# Patient Record
Sex: Female | Born: 1990 | Race: Black or African American | Hispanic: No | Marital: Single | Smoking: Former smoker
Health system: Southern US, Community
[De-identification: ages and names within clinical notes are randomized; demographics above are authoritative.]

## PROBLEM LIST (undated history)

## (undated) ENCOUNTER — Inpatient Hospital Stay (HOSPITAL_COMMUNITY): Payer: Self-pay

## (undated) DIAGNOSIS — F329 Major depressive disorder, single episode, unspecified: Secondary | ICD-10-CM

## (undated) DIAGNOSIS — Z91410 Personal history of adult physical and sexual abuse: Secondary | ICD-10-CM

## (undated) DIAGNOSIS — F32A Depression, unspecified: Secondary | ICD-10-CM

## (undated) DIAGNOSIS — F419 Anxiety disorder, unspecified: Secondary | ICD-10-CM

## (undated) DIAGNOSIS — R519 Headache, unspecified: Secondary | ICD-10-CM

## (undated) DIAGNOSIS — R0600 Dyspnea, unspecified: Secondary | ICD-10-CM

## (undated) DIAGNOSIS — IMO0002 Reserved for concepts with insufficient information to code with codable children: Secondary | ICD-10-CM

## (undated) DIAGNOSIS — A5909 Other urogenital trichomoniasis: Secondary | ICD-10-CM

## (undated) DIAGNOSIS — R87619 Unspecified abnormal cytological findings in specimens from cervix uteri: Secondary | ICD-10-CM

## (undated) HISTORY — PX: NO PAST SURGERIES: SHX2092

## (undated) HISTORY — DX: Personal history of adult physical and sexual abuse: Z91.410

## (undated) HISTORY — DX: Depression, unspecified: F32.A

## (undated) HISTORY — DX: Anxiety disorder, unspecified: F41.9

## (undated) HISTORY — DX: Headache, unspecified: R51.9

## (undated) HISTORY — DX: Other urogenital trichomoniasis: A59.09

---
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
## 2012-01-14 ENCOUNTER — Encounter (HOSPITAL_COMMUNITY): Payer: Self-pay | Admitting: *Deleted

## 2012-01-14 ENCOUNTER — Inpatient Hospital Stay (HOSPITAL_COMMUNITY)
Admission: AD | Admit: 2012-01-14 | Discharge: 2012-01-17 | DRG: 774 | Disposition: A | Discharge status: Home / Self Care | Payer: Medicaid Other | Source: Ambulatory Visit | Attending: Obstetrics and Gynecology | Admitting: Obstetrics and Gynecology

## 2012-01-14 DIAGNOSIS — A5619 Other chlamydial genitourinary infection: Secondary | ICD-10-CM | POA: Diagnosis present

## 2012-01-14 DIAGNOSIS — O99892 Other specified diseases and conditions complicating childbirth: Secondary | ICD-10-CM | POA: Diagnosis present

## 2012-01-14 DIAGNOSIS — N739 Female pelvic inflammatory disease, unspecified: Secondary | ICD-10-CM | POA: Diagnosis present

## 2012-01-14 DIAGNOSIS — O98319 Other infections with a predominantly sexual mode of transmission complicating pregnancy, unspecified trimester: Secondary | ICD-10-CM | POA: Diagnosis present

## 2012-01-14 DIAGNOSIS — Z22330 Carrier of Group B streptococcus: Secondary | ICD-10-CM

## 2012-01-14 HISTORY — DX: Reserved for concepts with insufficient information to code with codable children: IMO0002

## 2012-01-14 HISTORY — DX: Unspecified abnormal cytological findings in specimens from cervix uteri: R87.619

## 2012-01-14 NOTE — Progress Notes (Signed)
Contractions , was seen in office today for ?ROM, SVE 3 cm

## 2012-01-14 NOTE — Progress Notes (Signed)
Pt states she started having contractions about 2040 tonight. Pt states pain has increased and contractions are 3-4 minutes apart

## 2012-01-15 ENCOUNTER — Encounter (HOSPITAL_COMMUNITY): Payer: Self-pay | Admitting: Anesthesiology

## 2012-01-15 ENCOUNTER — Inpatient Hospital Stay (HOSPITAL_COMMUNITY): Payer: Medicaid Other | Admitting: Anesthesiology

## 2012-01-15 ENCOUNTER — Encounter (HOSPITAL_COMMUNITY): Payer: Self-pay

## 2012-01-15 LAB — CBC
HCT: 40.2 % (ref 36.0–46.0)
Platelets: 233 10*3/uL (ref 150–400)
RBC: 4.67 MIL/uL (ref 3.87–5.11)
RDW: 15.2 % (ref 11.5–15.5)
WBC: 8.7 10*3/uL (ref 4.0–10.5)

## 2012-01-15 MED ORDER — OXYTOCIN BOLUS FROM INFUSION
500.0000 mL | Freq: Once | INTRAVENOUS | Status: DC
  Filled 2012-01-15: qty 500

## 2012-01-15 MED ORDER — WITCH HAZEL-GLYCERIN EX PADS: 1.0000 "application " | MEDICATED_PAD | CUTANEOUS | Status: DC | PRN

## 2012-01-15 MED ORDER — OXYTOCIN 20 UNITS IN LACTATED RINGERS INFUSION - SIMPLE
125.0000 mL/h | Freq: Once | INTRAVENOUS | Status: AC
  Administered 2012-01-15: 999 mL/h via INTRAVENOUS

## 2012-01-15 MED ORDER — LIDOCAINE HCL 1.5 % IJ SOLN
INTRAMUSCULAR | Status: DC | PRN
  Administered 2012-01-15: 4 mL via EPIDURAL
  Administered 2012-01-15: 5 mL via EPIDURAL

## 2012-01-15 MED ORDER — LACTATED RINGERS IV SOLN: 500.0000 mL | INTRAVENOUS | Status: DC | PRN

## 2012-01-15 MED ORDER — PENICILLIN G POTASSIUM 5000000 UNITS IJ SOLR
2.5000 10*6.[IU] | INTRAVENOUS | Status: DC
  Administered 2012-01-15: 2.5 10*6.[IU] via INTRAVENOUS
  Filled 2012-01-15 (×4): qty 2.5

## 2012-01-15 MED ORDER — ONDANSETRON HCL 4 MG PO TABS: 4.0000 mg | ORAL_TABLET | ORAL | Status: DC | PRN

## 2012-01-15 MED ORDER — LIDOCAINE HCL (PF) 1 % IJ SOLN
30.0000 mL | INTRAMUSCULAR | Status: DC | PRN
  Administered 2012-01-15: 30 mL via SUBCUTANEOUS
  Filled 2012-01-15: qty 30

## 2012-01-15 MED ORDER — LACTATED RINGERS IV SOLN
INTRAVENOUS | Status: DC
  Administered 2012-01-15: 02:00:00 via INTRAVENOUS

## 2012-01-15 MED ORDER — DIPHENHYDRAMINE HCL 25 MG PO CAPS: 25.0000 mg | ORAL_CAPSULE | Freq: Four times a day (QID) | ORAL | Status: DC | PRN

## 2012-01-15 MED ORDER — CITRIC ACID-SODIUM CITRATE 334-500 MG/5ML PO SOLN: 30.0000 mL | ORAL | Status: DC | PRN

## 2012-01-15 MED ORDER — FLEET ENEMA 7-19 GM/118ML RE ENEM: 1.0000 | ENEMA | RECTAL | Status: DC | PRN

## 2012-01-15 MED ORDER — AZITHROMYCIN 1 G PO PACK
1.0000 g | PACK | Freq: Once | ORAL | Status: AC
  Administered 2012-01-15: 1 g via ORAL
  Filled 2012-01-15: qty 1

## 2012-01-15 MED ORDER — EPHEDRINE 5 MG/ML INJ: 10.0000 mg | INTRAVENOUS | Status: DC | PRN

## 2012-01-15 MED ORDER — OXYTOCIN 20 UNITS IN LACTATED RINGERS INFUSION - SIMPLE
1.0000 m[IU]/min | INTRAVENOUS | Status: DC
  Administered 2012-01-15: 1 m[IU]/min via INTRAVENOUS
  Filled 2012-01-15: qty 1000

## 2012-01-15 MED ORDER — PHENYLEPHRINE 40 MCG/ML (10ML) SYRINGE FOR IV PUSH (FOR BLOOD PRESSURE SUPPORT)
80.0000 ug | PREFILLED_SYRINGE | INTRAVENOUS | Status: DC | PRN
  Filled 2012-01-15: qty 5

## 2012-01-15 MED ORDER — SIMETHICONE 80 MG PO CHEW: 80.0000 mg | CHEWABLE_TABLET | ORAL | Status: DC | PRN

## 2012-01-15 MED ORDER — PHENYLEPHRINE 40 MCG/ML (10ML) SYRINGE FOR IV PUSH (FOR BLOOD PRESSURE SUPPORT): 80.0000 ug | PREFILLED_SYRINGE | INTRAVENOUS | Status: DC | PRN

## 2012-01-15 MED ORDER — IBUPROFEN 600 MG PO TABS: 600.0000 mg | ORAL_TABLET | Freq: Four times a day (QID) | ORAL | Status: DC | PRN

## 2012-01-15 MED ORDER — DIBUCAINE 1 % RE OINT: 1.0000 "application " | TOPICAL_OINTMENT | RECTAL | Status: DC | PRN

## 2012-01-15 MED ORDER — ONDANSETRON HCL 4 MG/2ML IJ SOLN: 4.0000 mg | Freq: Four times a day (QID) | INTRAMUSCULAR | Status: DC | PRN

## 2012-01-15 MED ORDER — PRENATAL MULTIVITAMIN CH
1.0000 | ORAL_TABLET | Freq: Every day | ORAL | Status: DC
  Administered 2012-01-15 – 2012-01-16 (×2): 1 via ORAL
  Filled 2012-01-15 (×2): qty 1

## 2012-01-15 MED ORDER — EPHEDRINE 5 MG/ML INJ
10.0000 mg | INTRAVENOUS | Status: DC | PRN
  Filled 2012-01-15: qty 4

## 2012-01-15 MED ORDER — OXYCODONE-ACETAMINOPHEN 5-325 MG PO TABS: 1.0000 | ORAL_TABLET | ORAL | Status: DC | PRN

## 2012-01-15 MED ORDER — LANOLIN HYDROUS EX OINT: TOPICAL_OINTMENT | CUTANEOUS | Status: DC | PRN

## 2012-01-15 MED ORDER — ONDANSETRON HCL 4 MG/2ML IJ SOLN: 4.0000 mg | INTRAMUSCULAR | Status: DC | PRN

## 2012-01-15 MED ORDER — DIPHENHYDRAMINE HCL 50 MG/ML IJ SOLN: 12.5000 mg | INTRAMUSCULAR | Status: DC | PRN

## 2012-01-15 MED ORDER — FENTANYL 2.5 MCG/ML BUPIVACAINE 1/10 % EPIDURAL INFUSION (WH - ANES)
INTRAMUSCULAR | Status: DC | PRN
  Administered 2012-01-15: 15 mL/h via EPIDURAL

## 2012-01-15 MED ORDER — BENZOCAINE-MENTHOL 20-0.5 % EX AERO: 1.0000 "application " | INHALATION_SPRAY | CUTANEOUS | Status: DC | PRN

## 2012-01-15 MED ORDER — ZOLPIDEM TARTRATE 5 MG PO TABS: 5.0000 mg | ORAL_TABLET | Freq: Every evening | ORAL | Status: DC | PRN

## 2012-01-15 MED ORDER — IBUPROFEN 600 MG PO TABS
600.0000 mg | ORAL_TABLET | Freq: Four times a day (QID) | ORAL | Status: DC
  Administered 2012-01-15 – 2012-01-17 (×8): 600 mg via ORAL
  Filled 2012-01-15 (×8): qty 1

## 2012-01-15 MED ORDER — LACTATED RINGERS IV SOLN
500.0000 mL | Freq: Once | INTRAVENOUS | Status: AC
  Administered 2012-01-15: 500 mL via INTRAVENOUS

## 2012-01-15 MED ORDER — SENNOSIDES-DOCUSATE SODIUM 8.6-50 MG PO TABS
2.0000 | ORAL_TABLET | Freq: Every day | ORAL | Status: DC
  Administered 2012-01-15 – 2012-01-16 (×2): 2 via ORAL

## 2012-01-15 MED ORDER — ACETAMINOPHEN 325 MG PO TABS: 650.0000 mg | ORAL_TABLET | ORAL | Status: DC | PRN

## 2012-01-15 MED ORDER — TETANUS-DIPHTH-ACELL PERTUSSIS 5-2.5-18.5 LF-MCG/0.5 IM SUSP: 0.5000 mL | Freq: Once | INTRAMUSCULAR | Status: DC

## 2012-01-15 MED ORDER — TERBUTALINE SULFATE 1 MG/ML IJ SOLN: 0.2500 mg | Freq: Once | INTRAMUSCULAR | Status: DC | PRN

## 2012-01-15 MED ORDER — PENICILLIN G POTASSIUM 5000000 UNITS IJ SOLR
5.0000 10*6.[IU] | Freq: Once | INTRAVENOUS | Status: AC
  Administered 2012-01-15: 5 10*6.[IU] via INTRAVENOUS
  Filled 2012-01-15: qty 5

## 2012-01-15 MED ORDER — FENTANYL 2.5 MCG/ML BUPIVACAINE 1/10 % EPIDURAL INFUSION (WH - ANES)
14.0000 mL/h | INTRAMUSCULAR | Status: DC
  Administered 2012-01-15: 14 mL/h via EPIDURAL
  Filled 2012-01-15 (×2): qty 60

## 2012-01-15 NOTE — Progress Notes (Signed)
   Subjective: Pt comfortable, pushing with intermittent pressure  Objective: BP 130/73  Pulse 107  Temp(Src) 98.1 F (36.7 C) (Oral)  Resp 18  Ht 5\' 10"  (1.778 m)  Wt 117.482 kg (259 lb)  BMI 37.16 kg/m2  SpO2 100%  LMP 04/21/2011 I/O last 3 completed shifts: In: -  Out: 1500 [Urine:1500]    FHT:  FHR: 130 bpm, variability: moderate,  accelerations:  Present,  decelerations:  Absent UC:   regular, every 2-3 minutes SVE:   Dilation: 10 Effacement (%): 100 Station: +2 Exam by:: Dr. Senaida Ores Pt pushing about 30 minutes with good progress  Labs: Lab Results  Component Value Date   WBC 8.7 01/15/2012   HGB 13.3 01/15/2012   HCT 40.2 01/15/2012   MCV 86.1 01/15/2012   PLT 233 01/15/2012    Assessment / Plan: Pushing Christina Hancock W 01/15/2012, 8:54 AM

## 2012-01-15 NOTE — Progress Notes (Signed)
Pt may go to room 167. 

## 2012-01-15 NOTE — Anesthesia Procedure Notes (Signed)
Epidural Patient location during procedure: OB Start time: 01/15/2012 2:15 AM  Staffing Anesthesiologist: Alquan Morrish A. Performed by: anesthesiologist   Preanesthetic Checklist Completed: patient identified, site marked, surgical consent, pre-op evaluation, timeout performed, IV checked, risks and benefits discussed and monitors and equipment checked  Epidural Patient position: sitting Prep: site prepped and draped and DuraPrep Patient monitoring: continuous pulse ox and blood pressure Approach: midline Injection technique: LOR air  Needle:  Needle type: Tuohy  Needle gauge: 17 G Needle length: 9 cm Needle insertion depth: 9 cm Catheter type: closed end flexible Catheter size: 19 Gauge Catheter at skin depth: 14 cm Test dose: negative and 1.5% lidocaine  Assessment Events: blood not aspirated, injection not painful, no injection resistance, negative IV test and no paresthesia  Additional Notes Patient identified. Risks and benefits discussed including failed block, incomplete  Pain control, post dural puncture headache, nerve damage, paralysis, blood pressure Changes, nausea, vomiting, reactions to medications-both toxic and allergic and post Partum back pain. All questions were answered. Patient expressed understanding and wished to proceed. Sterile technique was used throughout procedure. Epidural site was Dressed with sterile barrier dressing. No paresthesias, signs of intravascular injection Or signs of intrathecal spread were encountered.  Patient was more comfortable after the epidural was dosed. Please see RN's note for documentation of vital signs and FHR which are stable.

## 2012-01-15 NOTE — Anesthesia Postprocedure Evaluation (Signed)
  Anesthesia Post-op Note  Patient: Therapist, occupational  Procedure(s) Performed: * No procedures listed *  Patient Location: 109  Anesthesia Type: Epidural  Level of Consciousness: awake, alert  and oriented  Airway and Oxygen Therapy: Patient Spontanous Breathing  Post-op Pain: none  Post-op Assessment: Post-op Vital signs reviewed, Patient's Cardiovascular Status Stable, No headache, No backache, No residual numbness and No residual motor weakness  Post-op Vital Signs: Reviewed and stable  Complications: No apparent anesthesia complications

## 2012-01-15 NOTE — Progress Notes (Signed)
Patient ID: Christina Hancock, female   DOB: [birth year], 21 y.o.   MRN: [id number] Pt has made slow progress since admission in spite of pitocin. Per the RN exam the cervix was 7 cm dilated but I feel she is 5 cm . AROM produced possibly slightly meconium stained fluid.

## 2012-01-15 NOTE — H&P (Signed)
Christina Hancock, CARRIGER NO.:  [id number]  MEDICAL RECORD NO.:  [id number]  LOCATION:                                 FACILITY:  PHYSICIAN:  Malachi Pro. Ambrose Mantle, M.D. DATE OF BIRTH:  07/03/1991  DATE OF ADMISSION:  01/14/2012 DATE OF DISCHARGE:                             HISTORY & PHYSICAL   This is an 21 year old black female, para 0, gravida 1, EDC January 17, 2012, by an ultrasound done on September 01, 2011.  The patient began having contractions at 8:40 p.m. on January 14, 2012, and came to the hospital.  She was initially found to be 3-1/2 cm dilated.  She had been 3 cm in the office.  After a period of observation, she was thought to be 4-5 cm dilated and she was admitted.  After admission, the contractions spaced out somewhat and she was begun on Pitocin.  Her blood group and type is A positive with a negative antibody, rubella immune, RPR nonreactive, urine culture negative, hepatitis B surface antigen negative, HIV negative, GC negative, Chlamydia positive, hemoglobin electrophoresis AA.  Cystic fibrosis screening was negative. The patient was too advanced at her first visit for first trimester screening.  A quad screen was negative.  One hour Glucola test was 76. Group B strep was positive.  At the patient's first prenatal visit, she was found to be Chlamydia positive and she was prescribed Zithromax on September 09, 2011.  A test of cure revealed Chlamydia to be positive on October 21, 2011.  The patient was given another prescription for azithromycin.  The patient did not get the prescription filled, so Zithromax was re-prescribed on January 01, 2012, but the patient has never filled the prescription.  So after admission to the hospital, she was given 1 g of Zithromax.  PAST MEDICAL HISTORY:  She has had no surgeries.  She has no significant medical history.  She has no known drug allergies.  FAMILY HISTORY:  Maternal grandmother with breast cancer  and father, paternal grandfather and paternal grandmother with diabetes.  Maternal grandmother with high blood pressure and maternal grandmother with skin cancer.  The patient does not drink alcohol, smoke or take drugs.  She is in high school.  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  PHYSICAL EXAMINATION:  On admission, blood pressure is 110/93 to 147/72, respirations 18, pulse 74, temperature 97 and 98.3.  The patient is a well-developed, somewhat obese black female, in no distress.  She has an epidural. Heart normal size and sounds.  No murmurs.  Lungs are clear to auscultation.  The abdomen is soft, nontender.  At her last prenatal exam, the fundal height was 38 cm.  Fetal heart tones are normal.  The patient is on 5 milliunits a minute of Pitocin.  The cervix is 5 cm, 100% effaced, vertex is at a -1 station.  Artificial rupture of the membranes produced either clear fluid or minimally meconium-stained fluid.  Neither of the nurse nor I could determine whether might be a slight meconium.  IMPRESSION:  Intrauterine pregnancy at 39 weeks and 5 days, positive group B strep, positive Chlamydia.  The  patient is on penicillin.  She has been treated with azithromycin for the Chlamydia.  We will observe her for progress in labor.     Malachi Pro. Ambrose Mantle, M.D.     TFH/MEDQ  D:  01/15/2012  T:  01/15/2012  Job:  [id number]

## 2012-01-15 NOTE — Anesthesia Preprocedure Evaluation (Signed)
Anesthesia Evaluation  Patient identified by MRN, date of birth, ID band Patient awake    Reviewed: Allergy & Precautions, H&P , Patient's Chart, lab work & pertinent test results  Airway Mallampati: III TM Distance: >3 FB Neck ROM: full    Dental No notable dental hx. (+) Teeth Intact   Pulmonary neg pulmonary ROS,  clear to auscultation  Pulmonary exam normal       Cardiovascular neg cardio ROS regular Normal    Neuro/Psych Negative Neurological ROS  Negative Psych ROS   GI/Hepatic negative GI ROS, Neg liver ROS,   Endo/Other  Negative Endocrine ROS  Renal/GU negative Renal ROS  Genitourinary negative   Musculoskeletal negative musculoskeletal ROS (+)   Abdominal   Peds  Hematology negative hematology ROS (+)   Anesthesia Other Findings   Reproductive/Obstetrics (+) Pregnancy                           Anesthesia Physical Anesthesia Plan  ASA: III  Anesthesia Plan: Epidural   Post-op Pain Management:    Induction:   Airway Management Planned:   Additional Equipment:   Intra-op Plan:   Post-operative Plan:   Informed Consent: I have reviewed the patients History and Physical, chart, labs and discussed the procedure including the risks, benefits and alternatives for the proposed anesthesia with the patient or authorized representative who has indicated his/her understanding and acceptance.     Plan Discussed with: Anesthesiologist, CRNA and Surgeon  Anesthesia Plan Comments:         Anesthesia Quick Evaluation

## 2012-01-15 NOTE — Plan of Care (Signed)
Problem: Consults Goal: Birthing Suites Patient Information Press F2 to bring up selections list Outcome: Completed/Met Date Met:  01/15/12  Pt 37-[redacted] weeks EGA

## 2012-01-16 LAB — CBC
Hemoglobin: 11.1 g/dL — ABNORMAL LOW (ref 12.0–15.0)
MCH: 27.8 pg (ref 26.0–34.0)
MCHC: 31.9 g/dL (ref 30.0–36.0)

## 2012-01-16 NOTE — Progress Notes (Signed)
Post Partum Day 1 Subjective: no complaints, voiding and tolerating PO  Objective: Blood pressure 111/72, pulse 83, temperature 98 F (36.7 C), temperature source Oral, resp. rate 18, height 5\' 10"  (1.778 m), weight 117.482 kg (259 lb), last menstrual period 04/21/2011, SpO2 100.00%, unknown if currently breastfeeding.  Physical Exam:  General: alert Lochia: appropriate Uterine Fundus: firm    Basename 01/16/12 0530 01/15/12 0110  HGB 11.1* 13.3  HCT 34.8* 40.2    Assessment/Plan: Plan for discharge tomorrow Pt had chlamydia treated in labor--has scripts at pharmacy she has not picked up, may get them for her partner.  D/w her risk of re-infection.   LOS: 2 days   Cristan Scherzer W 01/16/2012, 7:00 AM

## 2012-01-16 NOTE — Progress Notes (Signed)
UR Chart review completed.  

## 2012-01-17 MED ORDER — IBUPROFEN 600 MG PO TABS: 600.0000 mg | ORAL_TABLET | Freq: Four times a day (QID) | ORAL | Status: AC

## 2012-01-17 NOTE — Discharge Summary (Signed)
Obstetric Discharge Summary Reason for Admission: onset of labor Prenatal Procedures: none Intrapartum Procedures: spontaneous vaginal delivery Postpartum Procedures: none Complications-Operative and Postpartum: labial laceration Hemoglobin  Date Value Range Status  01/16/2012 11.1* 12.0-15.0 (g/dL) Final     HCT  Date Value Range Status  01/16/2012 34.8* 36.0-46.0 (%) Final    Discharge Diagnoses: Term Pregnancy-delivered  Discharge Information: Date: 01/17/2012 Activity: pelvic rest Diet: routine Medications: Ibuprofen Condition: stable Instructions: refer to practice specific booklet Discharge to: home Follow-up Information    Follow up with Oliver Pila, MD. Schedule an appointment as soon as possible for a visit in 6 weeks.   Contact information:   [address], [address] [place] [address] [phone]          Newborn Data: Live born female  Birth Weight: 7 lb 13.9 oz (3569 g) APGAR: 9, 9  Home with mother.  Malcolm Quast D 01/17/2012, 9:53 AM

## 2012-01-17 NOTE — Progress Notes (Signed)
PPD #2 No problems Afeb, VSS D/c home 

## 2012-09-30 ENCOUNTER — Encounter (HOSPITAL_COMMUNITY): Payer: Self-pay | Admitting: Family Medicine

## 2012-09-30 ENCOUNTER — Emergency Department (HOSPITAL_COMMUNITY)
Admission: EM | Admit: 2012-09-30 | Discharge: 2012-09-30 | Disposition: A | Discharge status: Home / Self Care | Payer: Self-pay | Attending: Emergency Medicine | Admitting: Emergency Medicine

## 2012-09-30 DIAGNOSIS — B0230 Zoster ocular disease, unspecified: Secondary | ICD-10-CM | POA: Insufficient documentation

## 2012-09-30 MED ORDER — TETRACAINE HCL 0.5 % OP SOLN
1.0000 [drp] | Freq: Once | OPHTHALMIC | Status: AC
  Administered 2012-09-30: 2 [drp] via OPHTHALMIC

## 2012-09-30 MED ORDER — VALACYCLOVIR HCL 1 G PO TABS: 1000.0000 mg | ORAL_TABLET | Freq: Three times a day (TID) | ORAL | Status: AC

## 2012-09-30 MED ORDER — FLUORESCEIN SODIUM 1 MG OP STRP
1.0000 | ORAL_STRIP | Freq: Once | OPHTHALMIC | Status: AC
  Administered 2012-09-30: 1 via OPHTHALMIC

## 2012-09-30 MED ORDER — FLUORESCEIN SODIUM 1 MG OP STRP
ORAL_STRIP | OPHTHALMIC | Status: AC
  Filled 2012-09-30: qty 1

## 2012-09-30 MED ORDER — CIPROFLOXACIN HCL 0.3 % OP SOLN: 1.0000 [drp] | Freq: Four times a day (QID) | OPHTHALMIC | Status: DC

## 2012-09-30 NOTE — ED Notes (Signed)
Pr pt bumps on left eye, nose and redness to left eye. sts vision affected.

## 2012-09-30 NOTE — ED Notes (Signed)
Pt presents with 2 week h/o L eye redness and discomfort.  Pt reports h/o herpes, reports outbreak to nose x 2 weeks with eye involvement.  Pt reports vision change, watery eyes and increased pain with sunlight.

## 2012-09-30 NOTE — ED Provider Notes (Signed)
History   This chart was scribed for Christina Canal, MD by Charolett Bumpers . The patient was seen in room TR02C/TR02C. Patient's care was started at 1201.   CSN: [id number] Arrival date & time 09/30/12  1143  First MD Initiated Contact with Patient 09/30/12 1201      Chief Complaint  Patient presents with  . Rash  . Eye Pain     The history is provided by the patient. No language interpreter was used.  Christina Hancock is a 21 y.o. female who presents to the Emergency Department complaining of left eye pain with associated itching, redness and small bumps located on face for the past 2 weeks. She states the rash started on the left side of face and has moved across face and now involves the left eye. She states that her vision on the left is blurry. She states that she doesn't wear contacts or glasses. She denies any fevers, n/v, other rashes. She denies any recent travel or tick bites. She states her immunizations are UTD and received the vaccine for chicken pox. She denies ever having chicken pox.   Past Medical History  Diagnosis Date  . Abnormal Pap smear     chlamydia    Past Surgical History  Procedure Date  . No past surgeries     Family History  Problem Relation Age of Onset  . Diabetes Father     History  Substance Use Topics  . Smoking status: Never Smoker   . Smokeless tobacco: Never Used  . Alcohol Use: No    OB History    Grav Para Term Preterm Abortions TAB SAB Ect Mult Living   1 1 1  0 0 0 0 0 0 1      Review of Systems  Constitutional: Negative for fever and chills.  Eyes: Positive for pain, redness, itching and visual disturbance.  Respiratory: Negative for shortness of breath.   Gastrointestinal: Negative for nausea and vomiting.  Skin: Positive for rash.  Neurological: Negative for weakness.  All other systems reviewed and are negative.    Allergies  Review of patient's allergies indicates no known allergies.  Home Medications  No  current outpatient prescriptions on file.  BP 126/64  Pulse 88  Temp 98.2 F (36.8 C) (Oral)  Resp 18  SpO2 100%  LMP 09/08/2012  Physical Exam  Nursing note and vitals reviewed. Constitutional: She is oriented to person, place, and time. She appears well-developed and well-nourished. No distress.  HENT:  Head: Normocephalic and atraumatic.       Vesicular lesion on nasal bridge.   Eyes: EOM are normal. Pupils are equal, round, and reactive to light. Left conjunctiva is injected.         Vision acuity of right eye 20/20, left eye 20/30. Obvious conjunctival injection on left. There is possible dendritic formation around pupil, no obvious corneal abrasions on fluorescene scan.   Neck: Normal range of motion. Neck supple. No tracheal deviation present.  Cardiovascular: Normal rate, regular rhythm and normal heart sounds.   No murmur heard. Pulmonary/Chest: Effort normal and breath sounds normal. No respiratory distress. She has no wheezes.  Abdominal: Soft. She exhibits no distension.  Musculoskeletal: Normal range of motion. She exhibits no edema.  Neurological: She is alert and oriented to person, place, and time.  Skin: Skin is warm and dry.  Psychiatric: She has a normal mood and affect. Her behavior is normal.    ED Course  Procedures (including critical care  time)  DIAGNOSTIC STUDIES: Oxygen Saturation is 100% on room air, normal by my interpretation.    COORDINATION OF CARE:  12:05-Discussed planned course of treatment with the patient including preformed a fundoscopic exam with fluorescein, who is agreeable at this time.   12:21-Performed fundoscopic exam.   Labs Reviewed - No data to display No results found.   1. Herpes zoster ophthalmicus       MDM  Christina Hancock is a 21 y.o. female here with likely L eye herpes zoster. Likely has hutchingson sign on nose. Will start antivirals. I discussed case with ophthalmologist on call, Dr. Clarisa Kindred, who recommended  starting with valtrex and give antibiotic eye drops to start to prevent super infection. She needs to f/u with ophtho ASAP for possible topical steroids but she recommend not starting it right away.     This document was completed by the scribe at my direction and I have reviewed its accuracy. I have personally examined the patient and agrees with the above document.   Chaney Malling, MD       Christina Canal, MD 09/30/12 [phone]

## 2012-10-01 NOTE — ED Notes (Signed)
Prescription called in to [facility] at [phone] for acyclovir 800mg  po 5x/day x7 days per [facility].

## 2013-02-14 ENCOUNTER — Emergency Department (HOSPITAL_COMMUNITY): Payer: Self-pay

## 2013-02-14 ENCOUNTER — Encounter (HOSPITAL_COMMUNITY): Payer: Self-pay | Admitting: *Deleted

## 2013-02-14 ENCOUNTER — Emergency Department (HOSPITAL_COMMUNITY)
Admission: EM | Admit: 2013-02-14 | Discharge: 2013-02-14 | Disposition: A | Discharge status: Home / Self Care | Payer: Self-pay | Attending: Emergency Medicine | Admitting: Emergency Medicine

## 2013-02-14 DIAGNOSIS — Z8742 Personal history of other diseases of the female genital tract: Secondary | ICD-10-CM | POA: Insufficient documentation

## 2013-02-14 DIAGNOSIS — K8020 Calculus of gallbladder without cholecystitis without obstruction: Secondary | ICD-10-CM | POA: Insufficient documentation

## 2013-02-14 DIAGNOSIS — Z3202 Encounter for pregnancy test, result negative: Secondary | ICD-10-CM | POA: Insufficient documentation

## 2013-02-14 LAB — URINALYSIS, ROUTINE W REFLEX MICROSCOPIC
Glucose, UA: NEGATIVE mg/dL
Hgb urine dipstick: NEGATIVE
Ketones, ur: 15 mg/dL — AB
Nitrite: NEGATIVE
Protein, ur: 30 mg/dL — AB
Specific Gravity, Urine: 1.028 (ref 1.005–1.030)
Urobilinogen, UA: 1 mg/dL (ref 0.0–1.0)
pH: 6.5 (ref 5.0–8.0)

## 2013-02-14 LAB — CBC WITH DIFFERENTIAL/PLATELET
Basophils Absolute: 0 10*3/uL (ref 0.0–0.1)
Basophils Relative: 0 % (ref 0–1)
Eosinophils Absolute: 0 10*3/uL (ref 0.0–0.7)
Eosinophils Relative: 0 % (ref 0–5)
HCT: 28.7 % — ABNORMAL LOW (ref 36.0–46.0)
Hemoglobin: 8.6 g/dL — ABNORMAL LOW (ref 12.0–15.0)
Lymphocytes Relative: 15 % (ref 12–46)
Lymphs Abs: 1.2 10*3/uL (ref 0.7–4.0)
MCH: 20.2 pg — ABNORMAL LOW (ref 26.0–34.0)
MCHC: 30 g/dL (ref 30.0–36.0)
MCV: 67.4 fL — ABNORMAL LOW (ref 78.0–100.0)
Monocytes Absolute: 0.9 10*3/uL (ref 0.1–1.0)
Monocytes Relative: 12 % (ref 3–12)
Neutro Abs: 5.6 10*3/uL (ref 1.7–7.7)
Neutrophils Relative %: 73 % (ref 43–77)
Platelets: 475 10*3/uL — ABNORMAL HIGH (ref 150–400)
RBC: 4.26 MIL/uL (ref 3.87–5.11)
RDW: 17.3 % — ABNORMAL HIGH (ref 11.5–15.5)
WBC: 7.7 10*3/uL (ref 4.0–10.5)

## 2013-02-14 LAB — URINE MICROSCOPIC-ADD ON

## 2013-02-14 LAB — COMPREHENSIVE METABOLIC PANEL
ALT: 8 U/L (ref 0–35)
AST: 13 U/L (ref 0–37)
Albumin: 2.9 g/dL — ABNORMAL LOW (ref 3.5–5.2)
Alkaline Phosphatase: 60 U/L (ref 39–117)
BUN: 9 mg/dL (ref 6–23)
CO2: 24 mEq/L (ref 19–32)
Calcium: 9.3 mg/dL (ref 8.4–10.5)
Chloride: 102 mEq/L (ref 96–112)
Creatinine, Ser: 0.74 mg/dL (ref 0.50–1.10)
GFR calc Af Amer: >90 mL/min (ref >90)
GFR calc non Af Amer: >90 mL/min (ref >90)
Glucose, Bld: 97 mg/dL (ref 70–99)
Potassium: 4.3 mEq/L (ref 3.5–5.1)
Sodium: 136 mEq/L (ref 135–145)
Total Bilirubin: 0.4 mg/dL (ref 0.3–1.2)
Total Protein: 7.9 g/dL (ref 6.0–8.3)

## 2013-02-14 LAB — LIPASE, BLOOD: Lipase: 20 U/L (ref 11–59)

## 2013-02-14 MED ORDER — ONDANSETRON HCL 4 MG/2ML IJ SOLN
4.0000 mg | Freq: Once | INTRAMUSCULAR | Status: AC
  Administered 2013-02-14: 4 mg via INTRAVENOUS
  Filled 2013-02-14: qty 2

## 2013-02-14 MED ORDER — HYDROCODONE-ACETAMINOPHEN 5-325 MG PO TABS: 1.0000 | ORAL_TABLET | Freq: Four times a day (QID) | ORAL | Status: DC | PRN

## 2013-02-14 MED ORDER — FENTANYL CITRATE 0.05 MG/ML IJ SOLN
100.0000 ug | Freq: Once | INTRAMUSCULAR | Status: AC
  Administered 2013-02-14: 100 ug via INTRAVENOUS
  Filled 2013-02-14: qty 2

## 2013-02-14 MED ORDER — SODIUM CHLORIDE 0.9 % IV BOLUS (SEPSIS)
1000.0000 mL | Freq: Once | INTRAVENOUS | Status: AC
  Administered 2013-02-14: 1000 mL via INTRAVENOUS

## 2013-02-14 NOTE — ED Notes (Signed)
Did in and out cath on patient. Clear yellow urine in return

## 2013-02-14 NOTE — ED Notes (Signed)
Pt to ultrasound

## 2013-02-14 NOTE — ED Provider Notes (Signed)
History     CSN: [id number]  Arrival date & time 02/14/13  4098   First MD Initiated Contact with Patient 02/14/13 [phone]      Chief Complaint  Patient presents with  . Abdominal Pain    (Consider location/radiation/quality/duration/timing/severity/associated sxs/prior treatment) HPI Patient presents to the emergency department with right upper abdominal pain for the last week.  Patient, states she's not had any nausea, vomiting, fever, diarrhea, chest pain, shortness of breath, cough, back pain, or dysuria.  Patient, states, that nothing seems to make the pain considerably worse, other than she states eating and drinking at times.patient states she did not take anything prior to arrival for her discomfort. Past Medical History  Diagnosis Date  . Abnormal Pap smear     chlamydia    Past Surgical History  Procedure Laterality Date  . No past surgeries      Family History  Problem Relation Age of Onset  . Diabetes Father     History  Substance Use Topics  . Smoking status: Never Smoker   . Smokeless tobacco: Never Used  . Alcohol Use: No    OB History   Grav Para Term Preterm Abortions TAB SAB Ect Mult Living   1 1 1  0 0 0 0 0 0 1      Review of Systems All other systems negative except as documented in the HPI. All pertinent positives and negatives as reviewed in the HPI.  Allergies  Review of patient's allergies indicates no known allergies.  Home Medications   Current Outpatient Rx  Name  Route  Sig  Dispense  Refill  . naproxen sodium (ANAPROX) 220 MG tablet   Oral   Take 440-660 mg by mouth 2 (two) times daily as needed (for pain).           BP 115/67  Pulse 84  Temp(Src) 98.9 F (37.2 C) (Oral)  Resp 18  SpO2 100%  LMP 02/11/2013  Physical Exam  Nursing note and vitals reviewed. Constitutional: She is oriented to person, place, and time. She appears well-developed and well-nourished. No distress.  HENT:  Head: Normocephalic and atraumatic.    Mouth/Throat: Oropharynx is clear and moist.  Eyes: Pupils are equal, round, and reactive to light.  Neck: Normal range of motion. Neck supple.  Cardiovascular: Normal rate, regular rhythm and normal heart sounds.  Exam reveals no gallop and no friction rub.   No murmur heard. Pulmonary/Chest: Effort normal and breath sounds normal. No respiratory distress.  Abdominal: Soft. Normal appearance and bowel sounds are normal. She exhibits no distension. There is no hepatosplenomegaly. There is tenderness in the right upper quadrant. There is no rebound, no guarding and no CVA tenderness. No hernia.    Neurological: She is alert and oriented to person, place, and time. Coordination normal.  Skin: Skin is warm and dry. No rash noted.    ED Course  Procedures (including critical care time)  Labs Reviewed  CBC WITH DIFFERENTIAL - Abnormal; Notable for the following:    Hemoglobin 8.6 (*)    HCT 28.7 (*)    MCV 67.4 (*)    MCH 20.2 (*)    RDW 17.3 (*)    Platelets 475 (*)    All other components within normal limits  COMPREHENSIVE METABOLIC PANEL - Abnormal; Notable for the following:    Albumin 2.9 (*)    All other components within normal limits  URINALYSIS, ROUTINE W REFLEX MICROSCOPIC - Abnormal; Notable for the following:  Color, Urine AMBER (*)    APPearance HAZY (*)    Bilirubin Urine SMALL (*)    Ketones, ur 15 (*)    Protein, ur 30 (*)    Leukocytes, UA SMALL (*)    All other components within normal limits  LIPASE, BLOOD  URINE MICROSCOPIC-ADD ON  POCT PREGNANCY, URINE   US Abdomen Complete  02/14/2013  *RADIOLOGY REPORT*  Clinical Data:  Right upper quadrant abdominal pain.  ABDOMEN ULTRASOUND  Technique:  Complete abdominal ultrasound examination was performed including evaluation of the liver, gallbladder, bile ducts, pancreas, kidneys, spleen, IVC, and abdominal aorta.  Comparison:  None  Findings:  Gallbladder:  Multiple small and mobile calculi are present in the  gallbladder.  There is no evidence of gallbladder distention, wall thickening, pericholecystic fluid or elicitable sonographic Murphy's sign.  Common Bile Duct:  Normal caliber of 3 mm.  Liver:  Normal size and echotexture without focal parenchymal abnormality.  Patent portal vein with hepatopetal flow.  IVC:  Patent throughout its visualized course in the abdomen.  Pancreas:  Although the pancreas is difficult to visualize in its entirety, no focal pancreatic abnormality is identified.  Spleen:  The spleen is of normal echotexture and size.  Kidneys:  The right kidney measures 13 cm and the left kidney 13.3 cm.  Both have a normal sonographic appearance.  Abdominal Aorta:  Normal caliber abdominal aorta.  IMPRESSION: Cholelithiasis without evidence by ultrasound of cholecystitis or biliary obstruction.   Original Report Authenticated By: Irish Lack, M.D.      I spoke with Will from general surgery and he advised that the patient could followup in their office for reevaluation and recheck.  Patient is currently having no pain.patient will be advised return here for any worsening in her condition, such as fever, increased abdominal pain, and vomiting. the patientis advised to call the surgeon's office for an appointment.the patient is currently on her menstrual cycle and that may explain her drop in hemoglobin.  She is having no current symptoms of shortness of breath or lightheadedness   MDM   MDM Reviewed: vitals and nursing note Interpretation: labs and ultrasound Consults: general surgery           Carlyle Dolly, PA-C 02/14/13 1256

## 2013-02-14 NOTE — ED Notes (Signed)
Pt is here with complaints of mid upper abdominal and right upper quadrant area pain for one week.  No fever.  PT reports chills and sweating.  No vomiting or nausea.  Pt reports constipation.  LBM 2 days ago and pt states not passing gas.  Pt states she is on her menstral now.  No urinary symptoms

## 2013-02-14 NOTE — ED Provider Notes (Signed)
Medical screening examination/treatment/procedure(s) were performed by non-physician practitioner and as supervising physician I was immediately available for consultation/collaboration.   [name], MD 02/14/13 1550

## 2013-02-21 ENCOUNTER — Telehealth (HOSPITAL_COMMUNITY): Payer: Self-pay | Admitting: Emergency Medicine

## 2014-10-10 ENCOUNTER — Emergency Department (HOSPITAL_COMMUNITY): Payer: Medicaid Other

## 2014-10-10 ENCOUNTER — Emergency Department (HOSPITAL_COMMUNITY)
Admission: EM | Admit: 2014-10-10 | Discharge: 2014-10-10 | Disposition: A | Discharge status: Home / Self Care | Payer: Medicaid Other | Attending: Emergency Medicine | Admitting: Emergency Medicine

## 2014-10-10 ENCOUNTER — Encounter (HOSPITAL_COMMUNITY): Payer: Self-pay | Admitting: Emergency Medicine

## 2014-10-10 DIAGNOSIS — R1011 Right upper quadrant pain: Secondary | ICD-10-CM | POA: Diagnosis not present

## 2014-10-10 DIAGNOSIS — Z791 Long term (current) use of non-steroidal anti-inflammatories (NSAID): Secondary | ICD-10-CM | POA: Diagnosis not present

## 2014-10-10 DIAGNOSIS — K529 Noninfective gastroenteritis and colitis, unspecified: Secondary | ICD-10-CM | POA: Diagnosis not present

## 2014-10-10 DIAGNOSIS — Z8719 Personal history of other diseases of the digestive system: Secondary | ICD-10-CM

## 2014-10-10 DIAGNOSIS — R1013 Epigastric pain: Secondary | ICD-10-CM | POA: Diagnosis present

## 2014-10-10 LAB — CBC WITH DIFFERENTIAL/PLATELET
BASOS PCT: 0 % (ref 0–1)
Basophils Absolute: 0 10*3/uL (ref 0.0–0.1)
EOS ABS: 0.1 10*3/uL (ref 0.0–0.7)
Eosinophils Relative: 2 % (ref 0–5)
HCT: 27.1 % — ABNORMAL LOW (ref 36.0–46.0)
Hemoglobin: 7.3 g/dL — ABNORMAL LOW (ref 12.0–15.0)
Lymphocytes Relative: 28 % (ref 12–46)
Lymphs Abs: 1.5 10*3/uL (ref 0.7–4.0)
MCH: 16.2 pg — AB (ref 26.0–34.0)
MCHC: 26.9 g/dL — AB (ref 30.0–36.0)
MCV: 60 fL — ABNORMAL LOW (ref 78.0–100.0)
MONO ABS: 0.3 10*3/uL (ref 0.1–1.0)
Monocytes Relative: 6 % (ref 3–12)
NEUTROS PCT: 64 % (ref 43–77)
Neutro Abs: 3.6 10*3/uL (ref 1.7–7.7)
PLATELETS: 460 10*3/uL — AB (ref 150–400)
RBC: 4.52 MIL/uL (ref 3.87–5.11)
RDW: 20.7 % — ABNORMAL HIGH (ref 11.5–15.5)
WBC: 5.5 10*3/uL (ref 4.0–10.5)

## 2014-10-10 LAB — COMPREHENSIVE METABOLIC PANEL
ALT: 11 U/L (ref 0–35)
AST: 15 U/L (ref 0–37)
Albumin: 3.6 g/dL (ref 3.5–5.2)
Alkaline Phosphatase: 64 U/L (ref 39–117)
Anion gap: 11 (ref 5–15)
BUN: 5 mg/dL — ABNORMAL LOW (ref 6–23)
CALCIUM: 9 mg/dL (ref 8.4–10.5)
CO2: 24 mEq/L (ref 19–32)
Chloride: 106 mEq/L (ref 96–112)
Creatinine, Ser: 0.69 mg/dL (ref 0.50–1.10)
GFR calc non Af Amer: >90 mL/min (ref >90)
GLUCOSE: 91 mg/dL (ref 70–99)
Potassium: 4.4 mEq/L (ref 3.7–5.3)
SODIUM: 141 meq/L (ref 137–147)
TOTAL PROTEIN: 7.8 g/dL (ref 6.0–8.3)
Total Bilirubin: 0.3 mg/dL (ref 0.3–1.2)

## 2014-10-10 LAB — LIPASE, BLOOD: Lipase: 31 U/L (ref 11–59)

## 2014-10-10 MED ORDER — ONDANSETRON 8 MG PO TBDP: 8.0000 mg | ORAL_TABLET | Freq: Once | ORAL | Status: DC

## 2014-10-10 MED ORDER — SODIUM CHLORIDE 0.9 % IV BOLUS (SEPSIS): 1000.0000 mL | Freq: Once | INTRAVENOUS | Status: DC

## 2014-10-10 MED ORDER — ONDANSETRON HCL 4 MG/2ML IJ SOLN: 4.0000 mg | Freq: Once | INTRAMUSCULAR | Status: DC

## 2014-10-10 MED ORDER — ONDANSETRON 4 MG PO TBDP: ORAL_TABLET | ORAL | Status: DC

## 2014-10-10 NOTE — ED Notes (Signed)
Pt reports n/v/d yesterday has resolved today with only "some nausea" today but is having abdominal pain 7/10. Denies any fever. Denies urinary problems.

## 2014-10-10 NOTE — ED Notes (Signed)
Patient refused IV and IV med,

## 2014-10-10 NOTE — ED Provider Notes (Signed)
CSN: [id number]     Arrival date & time 10/10/14  1314 History   First MD Initiated Contact with Patient 10/10/14 1503     Chief Complaint  Patient presents with  . Abdominal Pain     (Consider location/radiation/quality/duration/timing/severity/associated sxs/prior Treatment) The history is provided by the patient and medical records. No language interpreter was used.    Christina Hancock is a 23 y.o. female  with history of gallstones presents to the Emergency Department complaining of gradual, persistent, progressively worsening epigastric abd pain onset yesterday but worsening today.  Pt reports 4 episodes NBNB emesis and 3-4 episodes of watery diarrhea without melena or hematochezia.  Pt denies recent travel, but has been exposed to cousins who all have N/V/D.  Patient reports that the nausea and vomiting have stopped after this morning but she continues to have persistent nausea.  Nothing makes it better and nothing makes it worse.  Pt denies fever, chills, headache, neck pain, chest pain, SOB, weakness, dizziness, syncope.  LMP: complete 3 days ago.     Past Medical History  Diagnosis Date  . Abnormal Pap smear     chlamydia   Past Surgical History  Procedure Laterality Date  . No past surgeries     Family History  Problem Relation Age of Onset  . Diabetes Father    History  Substance Use Topics  . Smoking status: Never Smoker   . Smokeless tobacco: Never Used  . Alcohol Use: No   OB History   Grav Para Term Preterm Abortions TAB SAB Ect Mult Living   1 1 1  0 0 0 0 0 0 1     Review of Systems  Constitutional: Negative for fever, diaphoresis, appetite change, fatigue and unexpected weight change.  HENT: Negative for mouth sores and trouble swallowing.   Respiratory: Negative for cough, chest tightness, shortness of breath, wheezing and stridor.   Cardiovascular: Negative for chest pain and palpitations.  Gastrointestinal: Positive for nausea, vomiting, abdominal pain and  diarrhea. Negative for constipation, blood in stool, abdominal distention and rectal pain.  Endocrine: Negative for polydipsia, polyphagia and polyuria.  Genitourinary: Negative for dysuria, urgency, frequency, hematuria, flank pain and difficulty urinating.  Musculoskeletal: Negative for back pain, neck pain and neck stiffness.  Skin: Negative for rash.  Neurological: Negative for weakness.  Hematological: Negative for adenopathy.  Psychiatric/Behavioral: Negative for confusion.  All other systems reviewed and are negative.     Allergies  Review of patient's allergies indicates no known allergies.  Home Medications   Prior to Admission medications   Medication Sig Start Date End Date Taking? Authorizing Provider  naproxen sodium (ANAPROX) 220 MG tablet Take 440-660 mg by mouth 2 (two) times daily as needed (for pain).   Yes Historical Provider, MD  ondansetron (ZOFRAN ODT) 4 MG disintegrating tablet 4mg  ODT q4 hours prn nausea/vomit 10/10/14   Janifer Gieselman, PA-C   BP 133/78  Pulse 85  Temp(Src) 97.6 F (36.4 C) (Oral)  Resp 16  SpO2 100%  LMP 10/07/2014 Physical Exam  Nursing note and vitals reviewed. Constitutional: She appears well-developed and well-nourished. No distress.  Awake, alert, nontoxic appearance  HENT:  Head: Normocephalic and atraumatic.  Mouth/Throat: Oropharynx is clear and moist. No oropharyngeal exudate.  Moist mucous membranes  Eyes: Conjunctivae are normal. No scleral icterus.  Neck: Normal range of motion. Neck supple.  Cardiovascular: Normal rate, regular rhythm, normal heart sounds and intact distal pulses.   No murmur heard. Pulmonary/Chest: Effort normal and breath sounds  normal. No respiratory distress. She has no wheezes.  Equal chest expansion  Abdominal: Soft. Bowel sounds are normal. She exhibits no distension and no mass. There is no hepatomegaly. There is tenderness in the right upper quadrant and epigastric area. There is no  rigidity, no rebound, no guarding, no CVA tenderness and negative Murphy's sign.  Right upper quadrant and epigastric tenderness without guarding rebound or positive Murphy sign No CVA tenderness No palpable hepatomegaly  Musculoskeletal: Normal range of motion. She exhibits no edema.  Neurological: She is alert.  Speech is clear and goal oriented Moves extremities without ataxia  Skin: Skin is warm and dry. She is not diaphoretic. No erythema.  Psychiatric: She has a normal mood and affect.    ED Course  Procedures (including critical care time) Labs Review Labs Reviewed  CBC WITH DIFFERENTIAL - Abnormal; Notable for the following:    Hemoglobin 7.3 (*)    HCT 27.1 (*)    MCV 60.0 (*)    MCH 16.2 (*)    MCHC 26.9 (*)    RDW 20.7 (*)    Platelets 460 (*)    All other components within normal limits  COMPREHENSIVE METABOLIC PANEL - Abnormal; Notable for the following:    BUN 5 (*)    All other components within normal limits  LIPASE, BLOOD  URINALYSIS, ROUTINE W REFLEX MICROSCOPIC  POC URINE PREG, ED    Imaging Review [place] Abdomen Limited Ruq  10/10/2014   CLINICAL DATA:  Right upper quadrant pain.  Known gallstones.  EXAM: US ABDOMEN LIMITED - RIGHT UPPER QUADRANT  COMPARISON:  02/14/2013  FINDINGS: Gallbladder:  Multiple gallstones, mobile. No wall thickening. No pericholecystic fluid.  Common bile duct:  Diameter: 2.7 mm  Liver:  No focal lesion identified. Within normal limits in parenchymal echogenicity.  IMPRESSION: 1. Cholelithiasis. No evidence of acute cholecystitis. No other abnormalities.   Electronically Signed   By: Amie Portlandavid  Ormond M.D.   On: 10/10/2014 16:51     EKG Interpretation None      MDM   Final diagnoses:  RUQ abdominal pain  History of gallstones  Gastroenteritis   [name] presents with nausea vomiting and diarrhea likely viral in nature however patient has history of gallstones and worsening epigastric and right upper quadrant abdominal pain  after cessation of vomiting. Will obtain basic labs and ultrasound to rule out cholecystitis, choledocholithiasis.  Patient well-appearing at this time.  5:16 PM Labs reassuring with normal lipase, no elevation in hepatic enzymes and no leukocytosis.  Patient refused several times IV, fluids and nausea medication. She also continued to refuse pain medication.  Patient refused full abdominal ultrasound but did consent to limited right upper quadrant as the concern is history of gallstones with epigastric pain.  Limited ultrasound with multiple gallstones no gallbladder wall thickening, negative sonographic Murphy sign and no evidence of acute cholecystitis.  Patient has been unable to provide a urine sample however she just finished her menstrual cycle, has no lower abdominal pain and denies all urinary symptoms and vaginal discharge.  Patient is nontoxic, nonseptic appearing, in no apparent distress.  Patient's pain and other symptoms adequately managed in emergency department.  Labs, imaging and vitals reviewed.  Patient does not meet the SIRS or Sepsis criteria.  On repeat exam abdomen remained soft with moderate tenderness in the epigastric region but without guarding, Murphy's sign or rigidity.  She does not have a surgical abdomin and there are no peritoneal signs.  No indication of appendicitis,  bowel obstruction, bowel perforation, cholecystitis, diverticulitis; doubt PID or ectopic pregnancy.  Patient discharged home with symptomatic treatment and given strict instructions for follow-up with their primary care physician.    I have personally reviewed patient's vitals, nursing note and any pertinent labs or imaging.  I performed an undressed physical exam.    It has been determined that no acute conditions requiring further emergency intervention are present at this time. The patient/guardian have been advised of the diagnosis and plan. I reviewed all labs and imaging including any potential  incidental findings. We have discussed signs and symptoms that warrant return to the ED and they are listed in the discharge instructions.    Vital signs are stable at discharge.   BP 133/78  Pulse 85  Temp(Src) 97.6 F (36.4 C) (Oral)  Resp 16  SpO2 100%  LMP 10/07/2014            Dierdre ForthHannah Adele Milson, PA-C 10/10/14 1719

## 2014-10-10 NOTE — Discharge Instructions (Signed)
1. Medications: zofran, usual home medications 2. Treatment: rest, drink plenty of fluids, advance diet slowly 3. Follow Up: Please followup with your primary doctor in 3 days for discussion of your diagnoses and further evaluation after today's visit; if you do not have a primary care doctor use the resource guide provided to find one; Please return to the ER for persistent vomiting, high fevers or worsening symptoms; you may also consult with [facility] as needed for discussion of removal of your gallbladder    Abdominal Pain, Women Abdominal (stomach, pelvic, or belly) pain can be caused by many things. It is important to tell your doctor:  The location of the pain.  Does it come and go or is it present all the time?  Are there things that start the pain (eating certain foods, exercise)?  Are there other symptoms associated with the pain (fever, nausea, vomiting, diarrhea)? All of this is helpful to know when trying to find the cause of the pain. CAUSES   Stomach: virus or bacteria infection, or ulcer.  Intestine: appendicitis (inflamed appendix), regional ileitis (Crohn's disease), ulcerative colitis (inflamed colon), irritable bowel syndrome, diverticulitis (inflamed diverticulum of the colon), or cancer of the stomach or intestine.  Gallbladder disease or stones in the gallbladder.  Kidney disease, kidney stones, or infection.  Pancreas infection or cancer.  Fibromyalgia (pain disorder).  Diseases of the female organs:  Uterus: fibroid (non-cancerous) tumors or infection.  Fallopian tubes: infection or tubal pregnancy.  Ovary: cysts or tumors.  Pelvic adhesions (scar tissue).  Endometriosis (uterus lining tissue growing in the pelvis and on the pelvic organs).  Pelvic congestion syndrome (female organs filling up with blood just before the menstrual period).  Pain with the menstrual period.  Pain with ovulation (producing an egg).  Pain with an IUD  (intrauterine device, birth control) in the uterus.  Cancer of the female organs.  Functional pain (pain not caused by a disease, may improve without treatment).  Psychological pain.  Depression. DIAGNOSIS  Your doctor will decide the seriousness of your pain by doing an examination.  Blood tests.  X-rays.  Ultrasound.  CT scan (computed tomography, special type of X-ray).  MRI (magnetic resonance imaging).  Cultures, for infection.  Barium enema (dye inserted in the large intestine, to better view it with X-rays).  Colonoscopy (looking in intestine with a lighted tube).  Laparoscopy (minor surgery, looking in abdomen with a lighted tube).  Major abdominal exploratory surgery (looking in abdomen with a large incision). TREATMENT  The treatment will depend on the cause of the pain.   Many cases can be observed and treated at home.  Over-the-counter medicines recommended by your caregiver.  Prescription medicine.  Antibiotics, for infection.  Birth control pills, for painful periods or for ovulation pain.  Hormone treatment, for endometriosis.  Nerve blocking injections.  Physical therapy.  Antidepressants.  Counseling with a psychologist or psychiatrist.  Minor or major surgery. HOME CARE INSTRUCTIONS   Do not take laxatives, unless directed by your caregiver.  Take over-the-counter pain medicine only if ordered by your caregiver. Do not take aspirin because it can cause an upset stomach or bleeding.  Try a clear liquid diet (broth or water) as ordered by your caregiver. Slowly move to a bland diet, as tolerated, if the pain is related to the stomach or intestine.  Have a thermometer and take your temperature several times a day, and record it.  Bed rest and sleep, if it helps the pain.  Avoid sexual intercourse, if it causes pain.  Avoid stressful situations.  Keep your follow-up appointments and tests, as your caregiver orders.  If the pain  does not go away with medicine or surgery, you may try:  Acupuncture.  Relaxation exercises (yoga, meditation).  Group therapy.  Counseling. SEEK MEDICAL CARE IF:   You notice certain foods cause stomach pain.  Your home care treatment is not helping your pain.  You need stronger pain medicine.  You want your IUD removed.  You feel faint or lightheaded.  You develop nausea and vomiting.  You develop a rash.  You are having side effects or an allergy to your medicine. SEEK IMMEDIATE MEDICAL CARE IF:   Your pain does not go away or gets worse.  You have a fever.  Your pain is felt only in portions of the abdomen. The right side could possibly be appendicitis. The left lower portion of the abdomen could be colitis or diverticulitis.  You are passing blood in your stools (bright red or black tarry stools, with or without vomiting).  You have blood in your urine.  You develop chills, with or without a fever.  You pass out. MAKE SURE YOU:   Understand these instructions.  Will watch your condition.  Will get help right away if you are not doing well or get worse. Document Released: 09/28/2007 Document Revised: 04/17/2014 Document Reviewed: 10/18/2009 [facility] Patient Information 2015 [employer] [place]. This information is not intended to replace advice given to you by your health care provider. Make sure you discuss any questions you have with your health care provider.

## 2014-10-10 NOTE — ED Notes (Signed)
Patient refused saline lock.

## 2014-10-10 NOTE — ED Provider Notes (Signed)
Medical screening examination/treatment/procedure(s) were performed by non-physician practitioner and as supervising physician I was immediately available for consultation/collaboration.   EKG Interpretation None       Leticia Mcdiarmid, MD 10/10/14 2336 

## 2014-10-16 ENCOUNTER — Encounter (HOSPITAL_COMMUNITY): Payer: Self-pay | Admitting: Emergency Medicine

## 2015-03-12 ENCOUNTER — Emergency Department (HOSPITAL_COMMUNITY)
Admission: EM | Admit: 2015-03-12 | Discharge: 2015-03-12 | Disposition: A | Discharge status: Home / Self Care | Payer: Medicaid Other | Attending: Emergency Medicine | Admitting: Emergency Medicine

## 2015-03-12 ENCOUNTER — Encounter (HOSPITAL_COMMUNITY): Payer: Self-pay | Admitting: Emergency Medicine

## 2015-03-12 DIAGNOSIS — Z3202 Encounter for pregnancy test, result negative: Secondary | ICD-10-CM | POA: Diagnosis not present

## 2015-03-12 DIAGNOSIS — Z720 Tobacco use: Secondary | ICD-10-CM | POA: Diagnosis not present

## 2015-03-12 DIAGNOSIS — A599 Trichomoniasis, unspecified: Secondary | ICD-10-CM

## 2015-03-12 DIAGNOSIS — D649 Anemia, unspecified: Secondary | ICD-10-CM

## 2015-03-12 DIAGNOSIS — Z79899 Other long term (current) drug therapy: Secondary | ICD-10-CM | POA: Diagnosis not present

## 2015-03-12 DIAGNOSIS — R109 Unspecified abdominal pain: Secondary | ICD-10-CM | POA: Diagnosis present

## 2015-03-12 DIAGNOSIS — R1110 Vomiting, unspecified: Secondary | ICD-10-CM | POA: Diagnosis not present

## 2015-03-12 LAB — COMPREHENSIVE METABOLIC PANEL
ALT: 12 U/L (ref 0–35)
AST: 18 U/L (ref 0–37)
Albumin: 3.6 g/dL (ref 3.5–5.2)
Alkaline Phosphatase: 49 U/L (ref 39–117)
Anion gap: 5 (ref 5–15)
BUN: 9 mg/dL (ref 6–23)
CO2: 24 mmol/L (ref 19–32)
Calcium: 8.6 mg/dL (ref 8.4–10.5)
Chloride: 109 mmol/L (ref 96–112)
Creatinine, Ser: 0.77 mg/dL (ref 0.50–1.10)
GFR calc Af Amer: >90 mL/min (ref >90)
GFR calc non Af Amer: >90 mL/min (ref >90)
Glucose, Bld: 89 mg/dL (ref 70–99)
Potassium: 3.7 mmol/L (ref 3.5–5.1)
Sodium: 138 mmol/L (ref 135–145)
Total Bilirubin: 0.5 mg/dL (ref 0.3–1.2)
Total Protein: 7.2 g/dL (ref 6.0–8.3)

## 2015-03-12 LAB — CBC WITH DIFFERENTIAL/PLATELET
Basophils Absolute: 0 10*3/uL (ref 0.0–0.1)
Basophils Relative: 0 % (ref 0–1)
Eosinophils Absolute: 0.1 10*3/uL (ref 0.0–0.7)
Eosinophils Relative: 3 % (ref 0–5)
HCT: 29.3 % — ABNORMAL LOW (ref 36.0–46.0)
Hemoglobin: 8 g/dL — ABNORMAL LOW (ref 12.0–15.0)
Lymphocytes Relative: 33 % (ref 12–46)
Lymphs Abs: 1.6 10*3/uL (ref 0.7–4.0)
MCH: 16.7 pg — ABNORMAL LOW (ref 26.0–34.0)
MCHC: 27.3 g/dL — ABNORMAL LOW (ref 30.0–36.0)
MCV: 61.2 fL — ABNORMAL LOW (ref 78.0–100.0)
Monocytes Absolute: 0.5 10*3/uL (ref 0.1–1.0)
Monocytes Relative: 11 % (ref 3–12)
Neutro Abs: 2.5 10*3/uL (ref 1.7–7.7)
Neutrophils Relative %: 53 % (ref 43–77)
Platelets: 372 10*3/uL (ref 150–400)
RBC: 4.79 MIL/uL (ref 3.87–5.11)
RDW: 20.6 % — ABNORMAL HIGH (ref 11.5–15.5)
WBC: 4.7 10*3/uL (ref 4.0–10.5)

## 2015-03-12 LAB — WET PREP, GENITAL: Yeast Wet Prep HPF POC: NONE SEEN

## 2015-03-12 LAB — URINALYSIS, ROUTINE W REFLEX MICROSCOPIC
BILIRUBIN URINE: NEGATIVE
GLUCOSE, UA: NEGATIVE mg/dL
Hgb urine dipstick: NEGATIVE
Ketones, ur: NEGATIVE mg/dL
Nitrite: NEGATIVE
PH: 5.5 (ref 5.0–8.0)
PROTEIN: NEGATIVE mg/dL
Specific Gravity, Urine: 1.025 (ref 1.005–1.030)
Urobilinogen, UA: 0.2 mg/dL (ref 0.0–1.0)

## 2015-03-12 LAB — URINE MICROSCOPIC-ADD ON

## 2015-03-12 LAB — GC/CHLAMYDIA PROBE AMP (~~LOC~~) NOT AT ARMC
Chlamydia: POSITIVE — AB
Neisseria Gonorrhea: NEGATIVE

## 2015-03-12 LAB — RPR: RPR Ser Ql: NONREACTIVE

## 2015-03-12 LAB — POC URINE PREG, ED: Preg Test, Ur: NEGATIVE

## 2015-03-12 MED ORDER — METRONIDAZOLE 500 MG PO TABS: 500.0000 mg | ORAL_TABLET | Freq: Two times a day (BID) | ORAL | Status: DC

## 2015-03-12 MED ORDER — FERROUS SULFATE 325 (65 FE) MG PO TABS: 325.0000 mg | ORAL_TABLET | Freq: Every day | ORAL | Status: DC

## 2015-03-12 MED ORDER — AZITHROMYCIN 250 MG PO TABS
1000.0000 mg | ORAL_TABLET | Freq: Once | ORAL | Status: AC
  Administered 2015-03-12: 1000 mg via ORAL
  Filled 2015-03-12: qty 4

## 2015-03-12 MED ORDER — LIDOCAINE HCL (PF) 1 % IJ SOLN
INTRAMUSCULAR | Status: AC
  Administered 2015-03-12: 5 mL
  Filled 2015-03-12: qty 5

## 2015-03-12 MED ORDER — CEFTRIAXONE SODIUM 250 MG IJ SOLR
250.0000 mg | INTRAMUSCULAR | Status: DC
  Administered 2015-03-12: 250 mg via INTRAMUSCULAR
  Filled 2015-03-12: qty 250

## 2015-03-12 NOTE — ED Provider Notes (Signed)
CSN: [id number]     Arrival date & time 03/12/15  0515 History   First MD Initiated Contact with Patient 03/12/15 [phone]     Chief Complaint  Patient presents with  . Abdominal Pain    HPI   24 year old female presents today with 2 episodes of vomiting. Patient reports she was at work this morning and felt fine she had 2 episodes of nonbloody vomiting. She denies associated symptoms including headache, upper respiratory symptoms, chest pain, shortness of breath, abdominal pain, diarrhea, vaginal discharge, changes in bowel or bladder functioning or characteristics. Patient denies fever, close contacts. Patient goes on to note a history of cholelithiasis for which she received a abdominal ultrasound showing multiple stones with no acute obstruction, this was on March 2014, October 2015. Pts main concern today was for pregnancy.   Past Medical History  Diagnosis Date  . Abnormal Pap smear     chlamydia   Past Surgical History  Procedure Laterality Date  . No past surgeries     Family History  Problem Relation Age of Onset  . Diabetes Father    History  Substance Use Topics  . Smoking status: Current Every Day Smoker -- 0.25 packs/day    Types: Cigarettes  . Smokeless tobacco: Never Used  . Alcohol Use: No   OB History    Gravida Para Term Preterm AB TAB SAB Ectopic Multiple Living   1 1 1  0 0 0 0 0 0 1     Review of Systems  All other systems reviewed and are negative.  Allergies  Review of patient's allergies indicates no known allergies.  Home Medications   Prior to Admission medications   Medication Sig Start Date End Date Taking? Authorizing Provider  BIOTIN PO Take 1 tablet by mouth daily.   Yes Historical Provider, MD  ibuprofen (ADVIL,MOTRIN) 200 MG tablet Take 400 mg by mouth every 6 (six) hours as needed for moderate pain.   Yes Historical Provider, MD  ondansetron (ZOFRAN ODT) 4 MG disintegrating tablet 4mg  ODT q4 hours prn nausea/vomit Patient not taking:  Reported on 03/12/2015 10/10/14   Hannah Muthersbaugh, PA-C   BP 133/66 mmHg  Pulse 88  Temp(Src) 98.2 F (36.8 C) (Oral)  Resp 18  Ht 5\' 11"  (1.803 m)  Wt 215 lb 9.6 oz (97.796 kg)  BMI 30.08 kg/m2  SpO2 100%  LMP 02/07/2015 (Approximate) Physical Exam  Constitutional: She is oriented to person, place, and time. She appears well-developed and well-nourished.  HENT:  Head: Normocephalic and atraumatic.  Eyes: Pupils are equal, round, and reactive to light.  Neck: Normal range of motion. Neck supple. No JVD present. No tracheal deviation present. No thyromegaly present.  Cardiovascular: Normal rate, regular rhythm, normal heart sounds and intact distal pulses.  Exam reveals no gallop and no friction rub.   No murmur heard. Pulmonary/Chest: Effort normal and breath sounds normal. No stridor. No respiratory distress. She has no wheezes. She has no rales. She exhibits no tenderness.  Abdominal: Soft. Bowel sounds are normal. She exhibits no distension and no mass. There is no tenderness. There is no rebound and no guarding.  Genitourinary: Uterus normal. No labial fusion. There is no rash, tenderness, lesion or injury on the right labia. There is no rash, tenderness, lesion or injury on the left labia. Cervix exhibits no motion tenderness, no discharge and no friability. Right adnexum displays no mass, no tenderness and no fullness. Left adnexum displays no mass, no tenderness and no fullness. No erythema,  tenderness or bleeding in the vagina. No foreign body around the vagina. No signs of injury around the vagina. No vaginal discharge found.  Musculoskeletal: Normal range of motion.  Lymphadenopathy:    She has no cervical adenopathy.  Neurological: She is alert and oriented to person, place, and time. Coordination normal.  Skin: Skin is warm and dry.  Psychiatric: She has a normal mood and affect. Her behavior is normal. Judgment and thought content normal.  Nursing note and vitals  reviewed.   ED Course  Procedures (including critical care time) Labs Review Labs Reviewed  URINALYSIS, ROUTINE W REFLEX MICROSCOPIC - Abnormal; Notable for the following:    APPearance CLOUDY (*)    Leukocytes, UA SMALL (*)    All other components within normal limits  URINE MICROSCOPIC-ADD ON - Abnormal; Notable for the following:    Squamous Epithelial / LPF MANY (*)    Bacteria, UA MANY (*)    All other components within normal limits  CBC WITH DIFFERENTIAL/PLATELET  COMPREHENSIVE METABOLIC PANEL  POC URINE PREG, ED    Imaging Review No results found.   EKG Interpretation None      MDM   Final diagnoses:  Trichimoniasis  Anemia, unspecified anemia type   Labs:cbc anemia, CMP, UA Trich, neg preg  Imaging:none  Consults: None  Therapeutics: None  Assessment: Trichomoniasis, vomiting, anemia  Plan: Patient's vomiting not associated with any other symptoms. No signs of abdominal pain on exam, normal CMP. PT does have trichomoniasis and will be treated with metronidazole. Likely she has other STD's; will treat for G/C at the advice and consent of pt. She received Oral Azithromycin 1 G and IM ceftriaxone. 250 mg. Instructed to not drink while taking metronidazole.  Pt was encouraged to practice safe sex and follow up with her gyn and PCP for further evaluation and management. Also instructed to have partners tested and treated. PT was given Fe sulfate for chronic anemia.       Eyvonne Mechanic, PA-C 03/14/15 1914  Gwyneth Sprout, MD 03/15/15 [phone]

## 2015-03-12 NOTE — ED Notes (Addendum)
Pt reports that she awoke yesterday morning with abdominal pain and nausea and x2 episodes emesis. Pt states "I think I may be pregnant." Pt reports LMP 2/24. Denies diarrhea, fever, or other complaints. Pt states she needs a doctors note for leaving work early as well.

## 2015-03-12 NOTE — Discharge Instructions (Signed)
Please follow-up with your primary care provider and GYN further evaluation and management. Please use protection and practice safe sex. Do not drink while taking metronidazole. Monitor for new or worsening symptoms, follow-up sooner if needed.

## 2015-03-12 NOTE — ED Notes (Signed)
Pt admits to intermittent lower abd pain x2 weeks, awoke this morning and experienced x2 episodes of vomiting. Pt denies diarrhea or fever - A&Ox4, no acute distress.

## 2015-03-13 ENCOUNTER — Telehealth (HOSPITAL_COMMUNITY): Payer: Self-pay

## 2015-03-13 LAB — HIV ANTIBODY (ROUTINE TESTING W REFLEX): HIV Screen 4th Generation wRfx: NONREACTIVE

## 2015-03-13 NOTE — ED Notes (Signed)
Positive for chlamydia. Treated per protocol. Advised to abstain from sexual activity x 10 days and to notify partner(s) for testing and treatment. [employer] faxed

## 2015-06-01 ENCOUNTER — Encounter (HOSPITAL_COMMUNITY): Payer: Self-pay | Admitting: Emergency Medicine

## 2015-06-01 ENCOUNTER — Emergency Department (HOSPITAL_COMMUNITY): Payer: No Typology Code available for payment source

## 2015-06-01 ENCOUNTER — Emergency Department (HOSPITAL_COMMUNITY)
Admission: EM | Admit: 2015-06-01 | Discharge: 2015-06-01 | Disposition: A | Discharge status: Home / Self Care | Payer: No Typology Code available for payment source | Attending: Emergency Medicine | Admitting: Emergency Medicine

## 2015-06-01 DIAGNOSIS — Y998 Other external cause status: Secondary | ICD-10-CM | POA: Diagnosis not present

## 2015-06-01 DIAGNOSIS — Z8619 Personal history of other infectious and parasitic diseases: Secondary | ICD-10-CM | POA: Diagnosis not present

## 2015-06-01 DIAGNOSIS — S0181XA Laceration without foreign body of other part of head, initial encounter: Secondary | ICD-10-CM | POA: Diagnosis not present

## 2015-06-01 DIAGNOSIS — Y92410 Unspecified street and highway as the place of occurrence of the external cause: Secondary | ICD-10-CM | POA: Insufficient documentation

## 2015-06-01 DIAGNOSIS — S80212A Abrasion, left knee, initial encounter: Secondary | ICD-10-CM | POA: Insufficient documentation

## 2015-06-01 DIAGNOSIS — S0993XA Unspecified injury of face, initial encounter: Secondary | ICD-10-CM | POA: Diagnosis present

## 2015-06-01 DIAGNOSIS — S80211A Abrasion, right knee, initial encounter: Secondary | ICD-10-CM | POA: Diagnosis not present

## 2015-06-01 DIAGNOSIS — Y9389 Activity, other specified: Secondary | ICD-10-CM | POA: Insufficient documentation

## 2015-06-01 DIAGNOSIS — Z79899 Other long term (current) drug therapy: Secondary | ICD-10-CM | POA: Insufficient documentation

## 2015-06-01 DIAGNOSIS — Z23 Encounter for immunization: Secondary | ICD-10-CM | POA: Diagnosis not present

## 2015-06-01 DIAGNOSIS — Z720 Tobacco use: Secondary | ICD-10-CM | POA: Diagnosis not present

## 2015-06-01 DIAGNOSIS — Z792 Long term (current) use of antibiotics: Secondary | ICD-10-CM | POA: Diagnosis not present

## 2015-06-01 MED ORDER — HYDROCODONE-ACETAMINOPHEN 5-325 MG PO TABS: 1.0000 | ORAL_TABLET | Freq: Once | ORAL | Status: DC

## 2015-06-01 MED ORDER — TETANUS-DIPHTH-ACELL PERTUSSIS 5-2.5-18.5 LF-MCG/0.5 IM SUSP
0.5000 mL | Freq: Once | INTRAMUSCULAR | Status: AC
  Administered 2015-06-01: 0.5 mL via INTRAMUSCULAR
  Filled 2015-06-01: qty 0.5

## 2015-06-01 MED ORDER — ACETAMINOPHEN 500 MG PO TABS: 1000.0000 mg | ORAL_TABLET | Freq: Once | ORAL | Status: DC

## 2015-06-01 NOTE — ED Notes (Signed)
Advised by x-ray she would be next.

## 2015-06-01 NOTE — ED Notes (Signed)
Pt comfortable with discharge and follow up instructions. Pt declines wheelchair, escorted to waiting area by this RN. No prescriptions. 
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
## 2016-04-08 ENCOUNTER — Emergency Department (HOSPITAL_COMMUNITY)
Admission: EM | Admit: 2016-04-08 | Discharge: 2016-04-08 | Disposition: A | Discharge status: Home / Self Care | Payer: Medicaid Other | Attending: Emergency Medicine | Admitting: Emergency Medicine

## 2016-04-08 ENCOUNTER — Encounter (HOSPITAL_COMMUNITY): Payer: Self-pay | Admitting: Emergency Medicine

## 2016-04-08 DIAGNOSIS — Z8619 Personal history of other infectious and parasitic diseases: Secondary | ICD-10-CM | POA: Insufficient documentation

## 2016-04-08 DIAGNOSIS — Z792 Long term (current) use of antibiotics: Secondary | ICD-10-CM | POA: Diagnosis not present

## 2016-04-08 DIAGNOSIS — Z3201 Encounter for pregnancy test, result positive: Secondary | ICD-10-CM | POA: Insufficient documentation

## 2016-04-08 DIAGNOSIS — Z3490 Encounter for supervision of normal pregnancy, unspecified, unspecified trimester: Secondary | ICD-10-CM

## 2016-04-08 DIAGNOSIS — F17210 Nicotine dependence, cigarettes, uncomplicated: Secondary | ICD-10-CM | POA: Diagnosis not present

## 2016-04-08 DIAGNOSIS — Z791 Long term (current) use of non-steroidal anti-inflammatories (NSAID): Secondary | ICD-10-CM | POA: Insufficient documentation

## 2016-04-08 DIAGNOSIS — Z3200 Encounter for pregnancy test, result unknown: Secondary | ICD-10-CM | POA: Diagnosis present

## 2016-04-08 LAB — POC URINE PREG, ED: Preg Test, Ur: POSITIVE — AB

## 2016-04-08 NOTE — ED Provider Notes (Signed)
CSN: [id number]     Arrival date & time 04/08/16  1459 History   First MD Initiated Contact with Patient 04/08/16 1505     Chief Complaint  Patient presents with  . Possible Pregnancy    HPI   Christina Hancock is a 25 y.o. female with a PMH of abnormal pap smear who presents to the ED with possible pregnancy. She notes her LMP was last month. She states she took 2 pregnancy tests this morning because she is 5 days late, which were both positive. She reports she came to the ED to make sure this was accurate. She denies abdominal pain or vaginal bleeding.   Past Medical History  Diagnosis Date  . Abnormal Pap smear     chlamydia   Past Surgical History  Procedure Laterality Date  . No past surgeries     Family History  Problem Relation Age of Onset  . Diabetes Father    Social History  Substance Use Topics  . Smoking status: Current Every Day Smoker -- 0.25 packs/day    Types: Cigarettes  . Smokeless tobacco: Never Used  . Alcohol Use: No   OB History    Gravida Para Term Preterm AB TAB SAB Ectopic Multiple Living   0 0 0 0 0 0 1      Review of Systems  Gastrointestinal: Negative for abdominal pain.  Genitourinary: Negative for vaginal bleeding and vaginal discharge.      Allergies  Review of patient's allergies indicates no known allergies.  Home Medications   Prior to Admission medications   Medication Sig Start Date End Date Taking? Authorizing Provider  amoxicillin (AMOXIL) 500 MG capsule Take 500 mg by mouth 2 (two) times daily.    Historical Provider, MD  ferrous sulfate 325 (65 FE) MG tablet Take 1 tablet (325 mg total) by mouth daily. Patient not taking: Reported on 06/01/2015 03/12/15   Eyvonne Mechanic, PA-C  ibuprofen (ADVIL,MOTRIN) 800 MG tablet Take 800 mg by mouth 2 (two) times daily.    Historical Provider, MD  metroNIDAZOLE (FLAGYL) 500 MG tablet Take 1 tablet (500 mg total) by mouth 2 (two) times daily. Patient not taking: Reported on 06/01/2015  03/12/15   Eyvonne Mechanic, PA-C  ondansetron (ZOFRAN ODT) 4 MG disintegrating tablet  ODT q4 hours prn nausea/vomit Patient not taking: Reported on 03/12/2015 10/10/14   Dahlia Client Muthersbaugh, PA-C    BP 116/77 mmHg  Pulse 91  Temp(Src) 98.6 F (37 C) (Oral)  Resp 14  SpO2 100%  LMP 03/03/2016 Physical Exam  Constitutional: She is oriented to person, place, and time. She appears well-developed and well-nourished. No distress.  HENT:  Head: Normocephalic and atraumatic.  Right Ear: External ear normal.  Left Ear: External ear normal.  Nose: Nose normal.  Mouth/Throat: Uvula is midline, oropharynx is clear and moist and mucous membranes are normal. No oropharyngeal exudate.  Eyes: Conjunctivae, EOM and lids are normal. Pupils are equal, round, and reactive to light. Right eye exhibits no discharge. Left eye exhibits no discharge. No scleral icterus.  Neck: Normal range of motion. Neck supple.  Cardiovascular: Normal rate, regular rhythm, normal heart sounds, intact distal pulses and normal pulses.   Pulmonary/Chest: Effort normal and breath sounds normal. No respiratory distress. She has no wheezes. She has no rales.  Abdominal: Soft. Normal appearance and bowel sounds are normal. She exhibits no distension and no mass. There is no tenderness. There is no rigidity, no rebound and no guarding.  Musculoskeletal:  Normal range of motion. She exhibits no edema or tenderness.  Neurological: She is alert and oriented to person, place, and time.  Skin: Skin is warm, dry and intact. No rash noted. She is not diaphoretic. No erythema. No pallor.  Psychiatric: She has a normal mood and affect. Her speech is normal and behavior is normal.  Nursing note and vitals reviewed.   ED Course  Procedures (including critical care time)  Labs Review Labs Reviewed  POC URINE PREG, ED - Abnormal; Notable for the following:    Preg Test, Ur POSITIVE (*)    All other components within normal limits     Imaging Review No results found.   I have personally reviewed and evaluated these lab results as part of my medical decision-making.   EKG Interpretation None      MDM   Final diagnoses:  Pregnancy    25 year old female presents due to taking two pregnancy tests at home and results returning positive. States she was 5 days late, which prompted her to take the tests. Denies abdominal pain or vaginal bleeding. Patient is afebrile. Vital signs stable. Urine pregnancy positive. Discussed results with patient. Advised to take prenatal multivitamin and to follow-up with her OB. Strict return precautions discussed. Patient verbalizes her understanding and is in agreement with plan.  BP 116/77 mmHg  Pulse 91  Temp(Src) 98.6 F (37 C) (Oral)  Resp 14  SpO2 100%  LMP 03/03/2016     Mady GemmaElizabeth C Nehemias Sauceda, PA-C 04/08/16 1625  Pricilla LovelessScott Goldston, MD 04/10/16 [phone]

## 2016-04-08 NOTE — ED Notes (Signed)
Pt states she had two positive pregnancy tests at home and would like confirmation from a test here as well. Pt states her MD could not see her for a month. LMP 3/20

## 2016-04-08 NOTE — Discharge Instructions (Signed)
1. Medications: prenatal vitamin, usual home medications 2. Treatment: rest, drink plenty of fluids 3. Follow Up: please followup with your OB for discussion of your diagnoses and further evaluation after today's visit; if you do not have a primary care doctor use the phone number listed in your discharge paperwork to find one; please return to the ER for abdominal pain, vaginal bleeding, new or worsening symptoms   First Trimester of Pregnancy The first trimester of pregnancy is from week 1 until the end of week 12 (months 1 through 3). During this time, your baby will begin to develop inside you. At 6-8 weeks, the eyes and face are formed, and the heartbeat can be seen on ultrasound. At the end of 12 weeks, all the baby's organs are formed. Prenatal care is all the medical care you receive before the birth of your baby. Make sure you get good prenatal care and follow all of your doctor's instructions. HOME CARE  Medicines  Take medicine only as told by your doctor. Some medicines are safe and some are not during pregnancy.  Take your prenatal vitamins as told by your doctor.  Take medicine that helps you poop (stool softener) as needed if your doctor says it is okay. Diet  Eat regular, healthy meals.  Your doctor will tell you the amount of weight gain that is right for you.  Avoid raw meat and uncooked cheese.  If you feel sick to your stomach (nauseous) or throw up (vomit):  Eat 4 or 5 small meals a day instead of 3 large meals.  Try eating a few soda crackers.  Drink liquids between meals instead of during meals.  If you have a hard time pooping (constipation):  Eat high-fiber foods like fresh vegetables, fruit, and whole grains.  Drink enough fluids to keep your pee (urine) clear or pale yellow. Activity and Exercise  Exercise only as told by your doctor. Stop exercising if you have cramps or pain in your lower belly (abdomen) or low back.  Try to avoid standing for long  periods of time. Move your legs often if you must stand in one place for a long time.  Avoid heavy lifting.  Wear low-heeled shoes. Sit and stand up straight.  You can have sex unless your doctor tells you not to. Relief of Pain or Discomfort  Wear a good support bra if your breasts are sore.  Take warm water baths (sitz baths) to soothe pain or discomfort caused by hemorrhoids. Use hemorrhoid cream if your doctor says it is okay.  Rest with your legs raised if you have leg cramps or low back pain.  Wear support hose if you have puffy, bulging veins (varicose veins) in your legs. Raise (elevate) your feet for 15 minutes, 3-4 times a day. Limit salt in your diet. Prenatal Care  Schedule your prenatal visits by the twelfth week of pregnancy.  Write down your questions. Take them to your prenatal visits.  Keep all your prenatal visits as told by your doctor. Safety  Wear your seat belt at all times when driving.  Make a list of emergency phone numbers. The list should include numbers for family, friends, the hospital, and police and fire departments. General Tips  Ask your doctor for a referral to a local prenatal class. Begin classes no later than at the start of month 6 of your pregnancy.  Ask for help if you need counseling or help with nutrition. Your doctor can give you advice or tell  you where to go for help.  Do not use hot tubs, steam rooms, or saunas.  Do not douche or use tampons or scented sanitary pads.  Do not cross your legs for long periods of time.  Avoid litter boxes and soil used by cats.  Avoid all smoking, herbs, and alcohol. Avoid drugs not approved by your doctor.  Do not use any tobacco products, including cigarettes, chewing tobacco, and electronic cigarettes. If you need help quitting, ask your doctor. You may get counseling or other support to help you quit.  Visit your dentist. At home, brush your teeth with a soft toothbrush. Be gentle when you  floss. GET HELP IF:  You are dizzy.  You have mild cramps or pressure in your lower belly.  You have a nagging pain in your belly area.  You continue to feel sick to your stomach, throw up, or have watery poop (diarrhea).  You have a bad smelling fluid coming from your vagina.  You have pain with peeing (urination).  You have increased puffiness (swelling) in your face, hands, legs, or ankles. GET HELP RIGHT AWAY IF:   You have a fever.  You are leaking fluid from your vagina.  You have spotting or bleeding from your vagina.  You have very bad belly cramping or pain.  You gain or lose weight rapidly.  You throw up blood. It may look like coffee grounds.  You are around people who have [place] measles, fifth disease, or chickenpox.  You have a very bad headache.  You have shortness of breath.  You have any kind of trauma, such as from a fall or a car accident.   This information is not intended to replace advice given to you by your health care provider. Make sure you discuss any questions you have with your health care provider.   Document Released: 05/19/2008 Document Revised: 12/22/2014 Document Reviewed: 10/11/2013 Elsevier Interactive Patient Education [employer]2016 Elsevier Inc.

## 2016-04-24 ENCOUNTER — Inpatient Hospital Stay (HOSPITAL_COMMUNITY)
Admission: AD | Admit: 2016-04-24 | Discharge: 2016-04-24 | Disposition: A | Discharge status: Home / Self Care | Payer: Medicaid Other | Source: Ambulatory Visit | Attending: Obstetrics and Gynecology | Admitting: Obstetrics and Gynecology

## 2016-04-24 NOTE — MAU Note (Signed)
Not in lobby

## 2016-04-24 NOTE — MAU Note (Signed)
Not in lobby #3 

## 2016-04-24 NOTE — MAU Note (Signed)
Not in lobby x1  

## 2016-08-11 LAB — OB RESULTS CONSOLE HIV ANTIBODY (ROUTINE TESTING): HIV: NONREACTIVE

## 2016-08-11 LAB — OB RESULTS CONSOLE HEPATITIS B SURFACE ANTIGEN: Hepatitis B Surface Ag: NEGATIVE

## 2016-08-11 LAB — OB RESULTS CONSOLE ANTIBODY SCREEN: ANTIBODY SCREEN: NEGATIVE

## 2016-08-11 LAB — OB RESULTS CONSOLE RPR: RPR: NONREACTIVE

## 2016-08-11 LAB — OB RESULTS CONSOLE GC/CHLAMYDIA
CHLAMYDIA, DNA PROBE: NEGATIVE
GC PROBE AMP, GENITAL: NEGATIVE

## 2016-08-11 LAB — OB RESULTS CONSOLE RUBELLA ANTIBODY, IGM: Rubella: IMMUNE

## 2016-08-11 LAB — OB RESULTS CONSOLE ABO/RH: RH TYPE: POSITIVE

## 2016-11-13 LAB — OB RESULTS CONSOLE GC/CHLAMYDIA
Chlamydia: NEGATIVE
GC PROBE AMP, GENITAL: NEGATIVE

## 2016-11-13 LAB — OB RESULTS CONSOLE GBS: GBS: NEGATIVE

## 2016-12-11 ENCOUNTER — Encounter (HOSPITAL_COMMUNITY): Payer: Self-pay | Admitting: *Deleted

## 2016-12-11 ENCOUNTER — Telehealth (HOSPITAL_COMMUNITY): Payer: Self-pay | Admitting: *Deleted

## 2016-12-11 NOTE — Telephone Encounter (Signed)
Preadmission screen  

## 2016-12-15 NOTE — L&D Delivery Note (Signed)
26 y.o. G2P2001 at 2659w1d delivered a viable female infant in cephalic, left posterior occiput position. Anterior shoulder delivered with ease. 60 sec delayed cord clamping. Cord clamped x2 and cut. Placenta delivered spontaneously intact with with 3VC and small amount of meconium in fluid. NICU team present for delivery. Blood gas obtained on cord blood.  Fundus firm on exam with massage and pitocin. Good hemostasis noted.    Laceration: right vaginal, left superficial sulcal Suture: 4.0 monocryl Good hemostasis noted. EBL: 150cc  Mom and baby recovering in LDR.    Apgars: 06/22/09 Weight: pending  Blood gas: pH 7.116  [name], MD PGY-1 12/16/2016, 5:26 PM   OB FELLOW DELIVERY ATTESTATION  I was gloved and present for the delivery in its entirety, and I agree with the above resident's note.    Jen MowElizabeth Antwian Santaana, DO OB Fellow 6:43 PM

## 2016-12-16 ENCOUNTER — Encounter (HOSPITAL_COMMUNITY): Payer: Self-pay | Admitting: *Deleted

## 2016-12-16 ENCOUNTER — Inpatient Hospital Stay (HOSPITAL_COMMUNITY)
Admission: RE | Admit: 2016-12-16 | Discharge: 2016-12-17 | DRG: 775 | Disposition: A | Discharge status: Home / Self Care | Payer: Medicaid Other | Source: Ambulatory Visit | Attending: Family Medicine | Admitting: Family Medicine

## 2016-12-16 ENCOUNTER — Inpatient Hospital Stay (HOSPITAL_COMMUNITY): Payer: Medicaid Other | Admitting: Anesthesiology

## 2016-12-16 DIAGNOSIS — O480 Post-term pregnancy: Secondary | ICD-10-CM | POA: Diagnosis present

## 2016-12-16 DIAGNOSIS — Z833 Family history of diabetes mellitus: Secondary | ICD-10-CM

## 2016-12-16 DIAGNOSIS — Z87891 Personal history of nicotine dependence: Secondary | ICD-10-CM

## 2016-12-16 DIAGNOSIS — F1290 Cannabis use, unspecified, uncomplicated: Secondary | ICD-10-CM

## 2016-12-16 DIAGNOSIS — Z3A41 41 weeks gestation of pregnancy: Secondary | ICD-10-CM

## 2016-12-16 DIAGNOSIS — O99324 Drug use complicating childbirth: Secondary | ICD-10-CM | POA: Diagnosis not present

## 2016-12-16 LAB — CBC
HCT: 39.6 % (ref 36.0–46.0)
Hemoglobin: 13.7 g/dL (ref 12.0–15.0)
MCH: 30.2 pg (ref 26.0–34.0)
MCHC: 34.6 g/dL (ref 30.0–36.0)
MCV: 87.4 fL (ref 78.0–100.0)
PLATELETS: 208 10*3/uL (ref 150–400)
RBC: 4.53 MIL/uL (ref 3.87–5.11)
RDW: 15.4 % (ref 11.5–15.5)
WBC: 6.3 10*3/uL (ref 4.0–10.5)

## 2016-12-16 LAB — RAPID URINE DRUG SCREEN, HOSP PERFORMED
AMPHETAMINES: NOT DETECTED
BARBITURATES: NOT DETECTED
Benzodiazepines: NOT DETECTED
Cocaine: NOT DETECTED
OPIATES: NOT DETECTED
TETRAHYDROCANNABINOL: POSITIVE — AB

## 2016-12-16 LAB — TYPE AND SCREEN
ABO/RH(D): A POS
Antibody Screen: NEGATIVE

## 2016-12-16 LAB — ABO/RH: ABO/RH(D): A POS

## 2016-12-16 MED ORDER — DIPHENHYDRAMINE HCL 25 MG PO CAPS: 25.0000 mg | ORAL_CAPSULE | Freq: Four times a day (QID) | ORAL | Status: DC | PRN

## 2016-12-16 MED ORDER — PHENYLEPHRINE 40 MCG/ML (10ML) SYRINGE FOR IV PUSH (FOR BLOOD PRESSURE SUPPORT)
80.0000 ug | PREFILLED_SYRINGE | INTRAVENOUS | Status: DC | PRN
  Filled 2016-12-16: qty 5

## 2016-12-16 MED ORDER — OXYCODONE-ACETAMINOPHEN 5-325 MG PO TABS: 2.0000 | ORAL_TABLET | ORAL | Status: DC | PRN

## 2016-12-16 MED ORDER — LACTATED RINGERS IV SOLN: 500.0000 mL | Freq: Once | INTRAVENOUS | Status: DC

## 2016-12-16 MED ORDER — MISOPROSTOL 25 MCG QUARTER TABLET
25.0000 ug | ORAL_TABLET | ORAL | Status: DC | PRN
  Administered 2016-12-16: 25 ug via VAGINAL
  Filled 2016-12-16: qty 0.25
  Filled 2016-12-16: qty 1

## 2016-12-16 MED ORDER — EPHEDRINE 5 MG/ML INJ
10.0000 mg | INTRAVENOUS | Status: DC | PRN
  Filled 2016-12-16: qty 4

## 2016-12-16 MED ORDER — ZOLPIDEM TARTRATE 5 MG PO TABS: 5.0000 mg | ORAL_TABLET | Freq: Every evening | ORAL | Status: DC | PRN

## 2016-12-16 MED ORDER — OXYCODONE-ACETAMINOPHEN 5-325 MG PO TABS: 1.0000 | ORAL_TABLET | ORAL | Status: DC | PRN

## 2016-12-16 MED ORDER — ONDANSETRON HCL 4 MG PO TABS: 4.0000 mg | ORAL_TABLET | ORAL | Status: DC | PRN

## 2016-12-16 MED ORDER — OXYTOCIN 40 UNITS IN LACTATED RINGERS INFUSION - SIMPLE MED: 2.5000 [IU]/h | INTRAVENOUS | Status: DC

## 2016-12-16 MED ORDER — TERBUTALINE SULFATE 1 MG/ML IJ SOLN
0.2500 mg | Freq: Once | INTRAMUSCULAR | Status: DC | PRN
  Filled 2016-12-16: qty 1

## 2016-12-16 MED ORDER — DIPHENHYDRAMINE HCL 50 MG/ML IJ SOLN: 12.5000 mg | INTRAMUSCULAR | Status: DC | PRN

## 2016-12-16 MED ORDER — ACETAMINOPHEN 325 MG PO TABS: 650.0000 mg | ORAL_TABLET | ORAL | Status: DC | PRN

## 2016-12-16 MED ORDER — LIDOCAINE HCL (PF) 1 % IJ SOLN
INTRAMUSCULAR | Status: DC | PRN
  Administered 2016-12-16: 4 mL
  Administered 2016-12-16: 6 mL via EPIDURAL

## 2016-12-16 MED ORDER — OXYTOCIN 40 UNITS IN LACTATED RINGERS INFUSION - SIMPLE MED
1.0000 m[IU]/min | INTRAVENOUS | Status: DC
  Administered 2016-12-16: 2 m[IU]/min via INTRAVENOUS
  Filled 2016-12-16: qty 1000

## 2016-12-16 MED ORDER — ONDANSETRON HCL 4 MG/2ML IJ SOLN: 4.0000 mg | INTRAMUSCULAR | Status: DC | PRN

## 2016-12-16 MED ORDER — COCONUT OIL OIL
1.0000 "application " | TOPICAL_OIL | Status: DC | PRN
  Administered 2016-12-17: 1 via TOPICAL
  Filled 2016-12-16: qty 120

## 2016-12-16 MED ORDER — SIMETHICONE 80 MG PO CHEW: 80.0000 mg | CHEWABLE_TABLET | ORAL | Status: DC | PRN

## 2016-12-16 MED ORDER — IBUPROFEN 600 MG PO TABS
600.0000 mg | ORAL_TABLET | Freq: Four times a day (QID) | ORAL | Status: DC
  Administered 2016-12-16 – 2016-12-17 (×4): 600 mg via ORAL
  Filled 2016-12-16 (×4): qty 1

## 2016-12-16 MED ORDER — TETANUS-DIPHTH-ACELL PERTUSSIS 5-2.5-18.5 LF-MCG/0.5 IM SUSP: 0.5000 mL | Freq: Once | INTRAMUSCULAR | Status: DC

## 2016-12-16 MED ORDER — BENZOCAINE-MENTHOL 20-0.5 % EX AERO
1.0000 "application " | INHALATION_SPRAY | CUTANEOUS | Status: DC | PRN
  Administered 2016-12-16: 1 via TOPICAL
  Filled 2016-12-16: qty 56

## 2016-12-16 MED ORDER — PHENYLEPHRINE 40 MCG/ML (10ML) SYRINGE FOR IV PUSH (FOR BLOOD PRESSURE SUPPORT)
80.0000 ug | PREFILLED_SYRINGE | INTRAVENOUS | Status: DC | PRN
  Filled 2016-12-16: qty 10
  Filled 2016-12-16: qty 5

## 2016-12-16 MED ORDER — DIBUCAINE 1 % RE OINT: 1.0000 "application " | TOPICAL_OINTMENT | RECTAL | Status: DC | PRN

## 2016-12-16 MED ORDER — OXYTOCIN BOLUS FROM INFUSION: 500.0000 mL | Freq: Once | INTRAVENOUS | Status: DC

## 2016-12-16 MED ORDER — SENNOSIDES-DOCUSATE SODIUM 8.6-50 MG PO TABS
2.0000 | ORAL_TABLET | ORAL | Status: DC
  Administered 2016-12-16: 2 via ORAL
  Filled 2016-12-16: qty 2

## 2016-12-16 MED ORDER — ACETAMINOPHEN 325 MG PO TABS
650.0000 mg | ORAL_TABLET | ORAL | Status: DC | PRN
  Administered 2016-12-16: 650 mg via ORAL
  Filled 2016-12-16: qty 2

## 2016-12-16 MED ORDER — WITCH HAZEL-GLYCERIN EX PADS: 1.0000 "application " | MEDICATED_PAD | CUTANEOUS | Status: DC | PRN

## 2016-12-16 MED ORDER — SERTRALINE HCL 50 MG PO TABS
50.0000 mg | ORAL_TABLET | Freq: Every day | ORAL | Status: DC
  Administered 2016-12-16: 50 mg via ORAL
  Filled 2016-12-16 (×2): qty 1

## 2016-12-16 MED ORDER — SOD CITRATE-CITRIC ACID 500-334 MG/5ML PO SOLN: 30.0000 mL | ORAL | Status: DC | PRN

## 2016-12-16 MED ORDER — ONDANSETRON HCL 4 MG/2ML IJ SOLN: 4.0000 mg | Freq: Four times a day (QID) | INTRAMUSCULAR | Status: DC | PRN

## 2016-12-16 MED ORDER — FENTANYL 2.5 MCG/ML BUPIVACAINE 1/10 % EPIDURAL INFUSION (WH - ANES)
14.0000 mL/h | INTRAMUSCULAR | Status: DC | PRN
Start: 2016-12-16 — End: 2016-12-16
  Administered 2016-12-16: 14 mL/h via EPIDURAL
  Filled 2016-12-16: qty 100

## 2016-12-16 MED ORDER — PRENATAL MULTIVITAMIN CH
1.0000 | ORAL_TABLET | Freq: Every day | ORAL | Status: DC
  Administered 2016-12-17: 1 via ORAL
  Filled 2016-12-16: qty 1

## 2016-12-16 MED ORDER — LACTATED RINGERS IV SOLN
INTRAVENOUS | Status: DC
  Administered 2016-12-16: 11:00:00 via INTRAVENOUS

## 2016-12-16 MED ORDER — LACTATED RINGERS IV SOLN: 500.0000 mL | INTRAVENOUS | Status: DC | PRN

## 2016-12-16 MED ORDER — LIDOCAINE HCL (PF) 1 % IJ SOLN
30.0000 mL | INTRAMUSCULAR | Status: DC | PRN
  Administered 2016-12-16: 30 mL via SUBCUTANEOUS
  Filled 2016-12-16: qty 30

## 2016-12-16 NOTE — Anesthesia Preprocedure Evaluation (Signed)
Anesthesia Evaluation  Patient identified by MRN, date of birth, ID band Patient awake    Reviewed: Allergy & Precautions, H&P , Patient's Chart, lab work & pertinent test results  Airway Mallampati: II  TM Distance: >3 FB Neck ROM: full    Dental  (+) Teeth Intact   Pulmonary former smoker,    breath sounds clear to auscultation       Cardiovascular  Rhythm:regular Rate:Normal     Neuro/Psych    GI/Hepatic   Endo/Other    Renal/GU      Musculoskeletal   Abdominal   Peds  Hematology   Anesthesia Other Findings       Reproductive/Obstetrics (+) Pregnancy                             Anesthesia Physical Anesthesia Plan  ASA: III  Anesthesia Plan: Epidural   Post-op Pain Management:    Induction:   Airway Management Planned:   Additional Equipment:   Intra-op Plan:   Post-operative Plan:   Informed Consent: I have reviewed the patients History and Physical, chart, labs and discussed the procedure including the risks, benefits and alternatives for the proposed anesthesia with the patient or authorized representative who has indicated his/her understanding and acceptance.   Dental Advisory Given  Plan Discussed with:   Anesthesia Plan Comments: (Labs checked- platelets confirmed with RN in room. Fetal heart tracing, per RN, reported to be stable enough for sitting procedure. Discussed epidural, and patient consents to the procedure:  included risk of possible headache,backache, failed block, allergic reaction, and nerve injury. This patient was asked if she had any questions or concerns before the procedure started.)        Anesthesia Quick Evaluation  
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
## 2016-12-16 NOTE — Anesthesia Pain Management Evaluation Note (Signed)
  CRNA Pain Management Visit Note  Patient: Christina Hancock, 26 y.o., female  "Hello [facility]. We have an anesthesia team available at all times to provide care throughout the hospital, including epidural management and anesthesia for C-section. I don't know your plan for the delivery whether it a natural birth, water birth, IV sedation, nitrous supplementation, doula or epidural, but we want to meet your pain goals."   1.Was your pain managed to your expectations on prior hospitalizations?   Yes   2.What is your expectation for pain management during this hospitalization?     Epidural  3.How can we help you reach that goal? epidural  Record the patient's initial score and the patient's pain goal.   Pain: 0/10   Pain Goal: 0/10 [facility] wants you to be able to say your pain was always managed very well.  Salome ArntSterling, Renetta Suman Marie 12/16/2016

## 2016-12-16 NOTE — Anesthesia Procedure Notes (Signed)
Epidural Patient location during procedure: OB Start time: 12/16/2016 3:34 PM End time: 12/16/2016 3:45 PM  Preanesthetic Checklist Completed: patient identified, site marked, surgical consent, pre-op evaluation, timeout performed, IV checked, risks and benefits discussed and monitors and equipment checked  Epidural Patient position: sitting Prep: site prepped and draped and DuraPrep Patient monitoring: continuous pulse ox and blood pressure Approach: midline Location: L3-L4 Injection technique: LOR air  Needle:  Needle type: Tuohy  Needle gauge: 17 G Needle length: 9 cm and 9 Needle insertion depth: 9 cm Catheter type: closed end flexible Catheter size: 19 Gauge Catheter at skin depth: 15 cm Test dose: negative  Assessment Events: blood not aspirated, injection not painful, no injection resistance, negative IV test and no paresthesia  Additional Notes Dosing of Epidural:  1st dose, through catheter ............................................Marland Kitchen.  Xylocaine 40 mg  2nd dose, through catheter, after waiting 3 minutes........Marland Kitchen.Xylocaine 60 mg    As each dose occurred, patient was free of IV sx; and patient exhibited no evidence of SA injection.  Patient is more comfortable after epidural dosed. Please see RN's note for documentation of vital signs,and FHR which are stable.  Patient reminded not to try to ambulate with numb legs, and that an RN must be present when she attempts to get up.

## 2016-12-16 NOTE — Progress Notes (Signed)
Patient ID: Christina Hancock, female   DOB: 10/31/1991, 26 y.o.   MRN: [id number]  S: Patient seen & examined for progress of labor. Patient comfortable in the room.    O:  Vitals:   12/16/16 0919 12/16/16 0925 12/16/16 1052 12/16/16 1158  BP: 129/72  (!) 115/59 (!) 112/59  Pulse: 86  75 62  Temp: 98.2 F (36.8 C)     TempSrc: Oral     Weight:  235 lb (106.6 kg)    Height:  5\' 8"  (1.727 m)      Dilation: 3 Effacement (%): 70 Cervical Position: Posterior Station: -3 Presentation: Vertex Exam by:: [place]   FHT: 135 bpm, mod var, +accels, no decels TOCO: Irregular  A/P: Will start pitocin Continue expectant management Anticipate SVD

## 2016-12-16 NOTE — Progress Notes (Signed)
Catheter Removed by RN prior to Transfer at [facility]

## 2016-12-17 ENCOUNTER — Encounter (HOSPITAL_COMMUNITY): Payer: Self-pay

## 2016-12-17 LAB — RPR: RPR: NONREACTIVE

## 2016-12-17 MED ORDER — SENNOSIDES-DOCUSATE SODIUM 8.6-50 MG PO TABS: 1.0000 | ORAL_TABLET | Freq: Every evening | ORAL | 0 refills | Status: DC | PRN

## 2016-12-17 NOTE — Lactation Note (Signed)
This note was copied from a baby's chart. Lactation Consultation NoteObserved mother latching infant . Infant latched with good depth. Observed good burst of suckling and swallows. Mother states that latch is not painful. Tips on better positioning.   Patient Name: Christina Donnita FallsXanah Hancock [id number]'s Date: 12/17/2016 Reason for consult: Follow-up assessment   Maternal Data    Feeding Feeding Type: Breast Fed  LATCH Score/Interventions Latch: Grasps breast easily, tongue down, lips flanged, rhythmical sucking.  Audible Swallowing: Spontaneous and intermittent  Type of Nipple: Everted at rest and after stimulation  Comfort (Breast/Nipple): Soft / non-tender     Hold (Positioning): Assistance needed to correctly position infant at breast and maintain latch. Intervention(s): Support Pillows;Position options  LATCH Score: 9  Lactation Tools Discussed/Used     Consult Status Consult Status: Follow-up    Stevan BornKendrick, Ashanta Amoroso [facility] 12/17/2016, 2:55 PM

## 2016-12-17 NOTE — Lactation Note (Signed)
This note was copied from a baby's chart. Lactation Consultation Note: infant is 3521 hours old . Mother states that infant is breastfeeding well. She states she is having a little tenderness on her nipples. Advised mother to apply ebm to nipples after feeding. Mother was given a harmony hand pump with instructions. Mother was advised to continue to breastfeed 8-12 times in 24 hours. Discussed engorgement treatment.  Mother is aware of available lactation services and community support.  Patient Name: Christina Hancock [id number]'s Date: 12/17/2016     Maternal Data    Feeding    LATCH Score/Interventions                      Lactation Tools Discussed/Used     Consult Status      Michel BickersKendrick, Christina Hancock 12/17/2016, 2:41 PM

## 2016-12-17 NOTE — Clinical Social Work Maternal (Signed)
CLINICAL SOCIAL WORK MATERNAL/CHILD NOTE  Patient Details  Name: Christina Hancock MRN: [id number] Date of Birth: [birth year]  Date:  12/17/2016  Clinical Social Worker Initiating Note:  Laurey Arrow Date/ Time Initiated:  12/24/16/1146     Child's Name:  Christina Hancock   Legal Guardian:  Mother   Need for Interpreter:  None   Date of Referral:  12/17/16     Reason for Referral:  Behavioral Health Issues, including SI , Current Substance Use/Substance Use During Pregnancy    Referral Source:  Central Nursery   Address:  [address] [place] [address]  Phone number:  [phone]   Household Members:  Self, Relatives, Siblings, Minor Children (MOB resides with MOB's grandparents and 2 younger siblings.)   Natural Supports (not living in the home):  Spouse/significant other, Immediate Family   Professional Supports: Therapist   Employment: Unemployed   Type of Work:     Education:  Database administrator Resources:  [employer]   Other Resources:  Physicist, medical , [place] Considerations Which May Impact Care:  Per [employer], MOB is Engineer, manufacturing.  Strengths:  Ability to meet basic needs , Home prepared for child , Understanding of illness   Risk Factors/Current Problems:  Mental Health Concerns , Substance Use    Cognitive State:  Alert , Able to Concentrate , Linear Thinking , Insightful    Mood/Affect:  Bright , Happy , Interested , Comfortable    CSW Assessment: CSW met with MOB to complete an assessment for a consult for hx of substance use. MOB was polite, inviting, and interested in meeting with CSW.  CSW inquired about MOB's substance use and MOB acknowledged the use of marijuana throughout pregnancy.  MOB reported that marijuana increased MOB's appetite and assisted MOB was sleeping. CSW thanked MOB for being honest. MOB also reported that MOB's OBGYN and MOB's therapist were aware that MOB was using marijuana. MOB  stated that MOB attempted several times to quit but was not successful. CSW informed MOB of the hospital's drug screen policy. CSW informed MOB of the 2 screenings for the infant. CSW explained to MOB that the infant's UDS or CDS is positive without an explanation for any substance, CSW would make a report to [facility];  MOB was understanding.  CSW offered MOB additional resources for SA treatment and interventions and MOB declined.  However, MOB reported that [facility]  CSW inquired about MOB's MH, and MOB acknowledged a hx of depression.  MOB reported hat MOB was dx with depression around 07/2016 and was prescribed Zoloft.  MOB reports being compliant with medication and overall feeling better.  MOB denied PPD with MOB's oldest child. CSW educated MOB about PPD. CSW informed MOB of possible supports and interventions to decrease PPD.  CSW also encouraged MOB to seek medical attention if needed for increased signs and symptoms for PPD. CSW reviewed safe sleep and SIDS information.  MOB communicated that she has a crib for the baby, and feels prepared for the infant.  MOB did not have any further questions, concerns, or needs at this time.  CSW Plan/Description:  Information/Referral to [employer] , [employer] , No Further Intervention Required/No Barriers to Discharge (MOB will monitor infant's cord and UDS and will make a report to [facility] if warranted. )   Laurey Arrow, MSW, [place] Work [phone]    Glenard Haring  D BOYD-GILYARD, LCSW 12/17/16, 11:49 AM

## 2016-12-17 NOTE — Discharge Instructions (Signed)

## 2016-12-17 NOTE — Anesthesia Postprocedure Evaluation (Signed)
Anesthesia Post Note  Patient: Therapist, occupationalXanah Hancock  Procedure(s) Performed: * No procedures listed *  Patient location during evaluation: Mother Baby Anesthesia Type: Epidural Level of consciousness: awake Pain management: pain level controlled Vital Signs Assessment: post-procedure vital signs reviewed and stable Respiratory status: spontaneous breathing Cardiovascular status: stable Postop Assessment: no headache, no backache and epidural receding Anesthetic complications: no        Last Vitals:  Vitals:   12/16/16 2100 12/17/16 0100  BP: 128/72 128/76  Pulse: 75 76  Resp: 20 16  Temp: 36.7 C 36.9 C    Last Pain:  Vitals:   12/17/16 0243  TempSrc:   PainSc: 0-No pain   Pain Goal: Patients Stated Pain Goal: 0 (12/16/16 2128)               Edison PaceWILKERSON,Yaroslav Gombos

## 2016-12-17 NOTE — Progress Notes (Signed)
UR chart review completed.  

## 2016-12-17 NOTE — Discharge Summary (Signed)
OB Discharge Summary     Patient Name: Christina Hancock DOB: Sep 10, 1991 MRN: [id number]  Date of admission: 12/16/2016 Delivering MD: Renne Musca   Date of discharge: 12/17/2016  Admitting diagnosis: INDUCTION Intrauterine pregnancy: [redacted]w[redacted]d     Secondary diagnosis:  Principal Problem:   NSVD (normal spontaneous vaginal delivery) Active Problems:   Labor and delivery, indication for care  Additional problems: None     Discharge diagnosis: Term Pregnancy Delivered                                                                                                Post partum procedures:None  Augmentation: None  Complications: None  Hospital course:  Onset of Labor With Vaginal Delivery     26 y.o. yo G2P2001 at [redacted]w[redacted]d was admitted in Active Labor on 12/16/2016. Patient had an uncomplicated labor course as follows:  Membrane Rupture Time/Date: 4:41 PM ,12/16/2016   Intrapartum Procedures: Episiotomy: None [1]                                         Lacerations:  Vaginal [6]  Patient had a delivery of a Viable infant. 12/16/2016  Information for the patient's newborn:  Gregor Hams [[id number]]  Delivery Method: Vaginal, Spontaneous Delivery (Filed from Delivery Summary)    Pateint had an uncomplicated postpartum course.  She is ambulating, tolerating a regular diet, passing flatus, and urinating well. Patient is discharged home in stable condition on 12/24/16.    Physical exam Vitals:   12/16/16 2000 12/16/16 2100 12/17/16 0100 12/17/16 0900  BP: 128/73 128/72 128/76 133/61  Pulse: 73 75 76 70  Resp: 18 20 16 18   Temp: 98.5 F (36.9 C) 98.1 F (36.7 C) 98.5 F (36.9 C) 98.4 F (36.9 C)  TempSrc: Oral Oral Oral Axillary  SpO2: 98%  100% 99%  Weight:      Height:       General: alert, cooperative and no distress Lochia: appropriate Uterine Fundus: firm Incision: N/A DVT Evaluation: No evidence of DVT seen on physical exam. Negative Homan's sign. No cords or calf  tenderness. Labs: Lab Results  Component Value Date   WBC 6.3 12/16/2016   HGB 13.7 12/16/2016   HCT 39.6 12/16/2016   MCV 87.4 12/16/2016   PLT 208 12/16/2016   CMP Latest Ref Rng & Units 03/12/2015  Glucose 70 - 99 mg/dL 89  BUN 6 - 23 mg/dL 9  Creatinine 5.62 - 1.30 mg/dL 8.65  Sodium 784 - 696 mmol/L 138  Potassium 3.5 - 5.1 mmol/L 3.7  Chloride 96 - 112 mmol/L 109  CO2 19 - 32 mmol/L 24  Calcium 8.4 - 10.5 mg/dL 8.6  Total Protein 6.0 - 8.3 g/dL 7.2  Total Bilirubin 0.3 - 1.2 mg/dL 0.5  Alkaline Phos 39 - 117 U/L 49  AST 0 - 37 U/L 18  ALT 0 - 35 U/L 12    Discharge instruction: per After Visit Summary and "Baby and Me Booklet".  After visit  meds:   Home meds: Ibuprofen prn  Diet: routine diet  Activity: Advance as tolerated. Pelvic rest for 6 weeks.   Outpatient follow up:6 weeks Follow up Appt:No future appointments. Follow up Visit:No Follow-up on file.  Postpartum contraception: IUD Mirena  Newborn Data: Live born female  Birth Weight: 6 lb 8.1 oz (2950 g) APGAR: 7, 9  Baby Feeding: Breast Disposition:home with mother   12/17/2016 Federico FlakeKimberly Niles Alene Bergerson, MD

## 2016-12-17 NOTE — Progress Notes (Signed)
Post Partum Day 1  Subjective: no complaints, up ad lib, voiding, tolerating PO and reports minimal pain well controlled with tylenol. Patient feels she is doing well and expresses interest in going home later today  Objective: Blood pressure 128/76, pulse 76, temperature 98.5 F (36.9 C), temperature source Oral, resp. rate 16, height 5\' 8"  (1.727 m), weight 106.6 kg (235 lb), last menstrual period 03/03/2016, SpO2 100 %, unknown if currently breastfeeding.  Physical Exam:  General: alert, cooperative and no distress Lochia: appropriate Uterine Fundus: firm Incision: none DVT Evaluation: No evidence of DVT seen on physical exam. No cords or calf tenderness. No significant calf/ankle edema.   Recent Labs  12/16/16 0930  HGB 13.7  HCT 39.6    Assessment/Plan: Discharge home, Lactation consult, Social Work consult and Contraception with mirena planned at postpartum visit   LOS: 1 day   Christina Hancock 12/17/2016, 7:18 AM

## 2016-12-17 NOTE — Lactation Note (Signed)
This note was copied from a baby's chart. Lactation Consultation Note Moms first time BF, states her son wouldn't latch but this baby is BF and latching well. Mom has large pendulum soft breast w/everted nipple at end of breast. Very compressible nipple and areola. Mom in semi fowlers position, unable to express colostrum at this time. Mom states she thinks she has seen colostrum. Educated I&O, STS, cluster feeding, supply and demand.  Mom plans on breast/formula feeding at some point, asked LC when would be a good time to start. LC advised after 2 weeks at least. Mom had pacifier in bed, discouraged for 2 weeks. Mom stated fine, didn't didn't really want her to have it, visitor brought it.  Educated newborn behavior and feeding habits. Mom encouraged to feed baby 8-12 times/24 hours and with feeding cues.  [facility]/LC brochure given w/resources, support groups and LC services. Patient Name: Christina Hancock Today's Date: 12/17/2016 Reason for consult: Initial assessment   Maternal Data Has patient been taught Hand Expression?: Yes Does the patient have breastfeeding experience prior to this delivery?: No  Feeding    LATCH Score/Interventions       Type of Nipple: Everted at rest and after stimulation  Comfort (Breast/Nipple): Soft / non-tender           Lactation Tools Discussed/Used WIC Program: Yes   Consult Status Consult Status: Follow-up Date: 12/18/16 Follow-up type: In-patient    Charyl DancerCARVER, Advith Martine G 12/17/2016, 7:15 AM

## 2016-12-18 NOTE — Progress Notes (Signed)
CSW made a [place] CPS for infant's positive UDS for THC.  Report was made with CPS intake worker, Eric Chin. CPS will follow-up with MOB and family.   Dozier Berkovich Boyd-Gilyard, MSW, LCSW Clinical Social Work [phone]   
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
## 2018-05-15 ENCOUNTER — Encounter (HOSPITAL_COMMUNITY): Payer: Self-pay

## 2018-05-15 ENCOUNTER — Emergency Department (HOSPITAL_COMMUNITY)
Admission: EM | Admit: 2018-05-15 | Discharge: 2018-05-15 | Disposition: A | Discharge status: Home / Self Care | Payer: Self-pay | Attending: Emergency Medicine | Admitting: Emergency Medicine

## 2018-05-15 ENCOUNTER — Other Ambulatory Visit: Payer: Self-pay

## 2018-05-15 DIAGNOSIS — K047 Periapical abscess without sinus: Secondary | ICD-10-CM | POA: Insufficient documentation

## 2018-05-15 DIAGNOSIS — Z79899 Other long term (current) drug therapy: Secondary | ICD-10-CM | POA: Insufficient documentation

## 2018-05-15 DIAGNOSIS — F17200 Nicotine dependence, unspecified, uncomplicated: Secondary | ICD-10-CM | POA: Insufficient documentation

## 2018-05-15 MED ORDER — TRAMADOL HCL 50 MG PO TABS
50.0000 mg | ORAL_TABLET | Freq: Four times a day (QID) | ORAL | 0 refills | Status: DC | PRN
Start: 2018-05-15 — End: 2019-10-07

## 2018-05-15 MED ORDER — PENICILLIN V POTASSIUM 500 MG PO TABS: 500.0000 mg | ORAL_TABLET | Freq: Four times a day (QID) | ORAL | 0 refills | Status: AC
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
## 2018-05-15 NOTE — ED Triage Notes (Signed)
Onset yesterday right lower tooth pain and swelling.

## 2018-05-15 NOTE — ED Notes (Signed)
Pt verbalized understanding discharge instructions and denies any further needs or questions at this time. VS stable, ambulatory and steady gait.   

## 2018-05-15 NOTE — Discharge Instructions (Addendum)
Your symptoms are caused by a dental abscess.  Start taking penicillin as prescribed, take until all gone.  Continue ibuprofen for pain.  Take tramadol for severe pain.  Please follow-up with your dentist or oral surgery as referred next week.  Return if worsening swelling, difficulty swallowing, difficulty breathing.

## 2019-09-12 ENCOUNTER — Ambulatory Visit (INDEPENDENT_AMBULATORY_CARE_PROVIDER_SITE_OTHER): Payer: Medicaid Other

## 2019-09-12 ENCOUNTER — Encounter: Payer: Self-pay | Admitting: Obstetrics

## 2019-09-12 ENCOUNTER — Other Ambulatory Visit: Payer: Self-pay

## 2019-09-12 DIAGNOSIS — Z3201 Encounter for pregnancy test, result positive: Secondary | ICD-10-CM | POA: Diagnosis not present

## 2019-09-12 DIAGNOSIS — Z3480 Encounter for supervision of other normal pregnancy, unspecified trimester: Secondary | ICD-10-CM

## 2019-09-12 MED ORDER — BLOOD PRESSURE KIT DEVI: 1.0000 | 0 refills | Status: DC

## 2019-09-12 NOTE — Progress Notes (Signed)
Christina Hancock presents today for UPT. She has no unusual complaints. LMP:07/24/2019    OBJECTIVE: Appears well, in no apparent distress.  OB History    Gravida  3   Para  2   Term  2   Preterm  0   AB  0   Living  2     SAB  0   TAB  0   Ectopic  0   Multiple  0   Live Births  1          Home UPT Result:POSITIVE x 2 In-Office UPT result: POSITIVE  I have reviewed the patient's medical, obstetrical, social, and family histories, and medications.   ASSESSMENT: Positive pregnancy test  PLAN Prenatal care to be completed at: [facility]. BP Cuff ordered.   PNV samples given [facility]  ND[facility]  [id number] LOT  [id number] EXP 11/31/2020

## 2019-10-07 ENCOUNTER — Encounter (HOSPITAL_COMMUNITY): Payer: Self-pay | Admitting: *Deleted

## 2019-10-07 ENCOUNTER — Telehealth: Payer: Medicaid Other | Admitting: Nurse Practitioner

## 2019-10-07 ENCOUNTER — Inpatient Hospital Stay (HOSPITAL_COMMUNITY)
Admission: AD | Admit: 2019-10-07 | Discharge: 2019-10-07 | Disposition: A | Discharge status: Home / Self Care | Payer: Medicaid Other | Attending: Obstetrics and Gynecology | Admitting: Obstetrics and Gynecology

## 2019-10-07 ENCOUNTER — Other Ambulatory Visit: Payer: Self-pay

## 2019-10-07 ENCOUNTER — Inpatient Hospital Stay (HOSPITAL_COMMUNITY): Payer: Medicaid Other

## 2019-10-07 DIAGNOSIS — O99331 Smoking (tobacco) complicating pregnancy, first trimester: Secondary | ICD-10-CM | POA: Diagnosis not present

## 2019-10-07 DIAGNOSIS — Z3480 Encounter for supervision of other normal pregnancy, unspecified trimester: Secondary | ICD-10-CM

## 2019-10-07 DIAGNOSIS — Z809 Family history of malignant neoplasm, unspecified: Secondary | ICD-10-CM | POA: Insufficient documentation

## 2019-10-07 DIAGNOSIS — O4691 Antepartum hemorrhage, unspecified, first trimester: Secondary | ICD-10-CM | POA: Diagnosis not present

## 2019-10-07 DIAGNOSIS — N939 Abnormal uterine and vaginal bleeding, unspecified: Secondary | ICD-10-CM

## 2019-10-07 DIAGNOSIS — F419 Anxiety disorder, unspecified: Secondary | ICD-10-CM | POA: Diagnosis not present

## 2019-10-07 DIAGNOSIS — F329 Major depressive disorder, single episode, unspecified: Secondary | ICD-10-CM | POA: Insufficient documentation

## 2019-10-07 DIAGNOSIS — O99341 Other mental disorders complicating pregnancy, first trimester: Secondary | ICD-10-CM | POA: Diagnosis not present

## 2019-10-07 DIAGNOSIS — Z79899 Other long term (current) drug therapy: Secondary | ICD-10-CM | POA: Insufficient documentation

## 2019-10-07 DIAGNOSIS — R102 Pelvic and perineal pain: Secondary | ICD-10-CM

## 2019-10-07 DIAGNOSIS — Z91040 Latex allergy status: Secondary | ICD-10-CM | POA: Insufficient documentation

## 2019-10-07 DIAGNOSIS — Z3A10 10 weeks gestation of pregnancy: Secondary | ICD-10-CM | POA: Diagnosis not present

## 2019-10-07 DIAGNOSIS — B9689 Other specified bacterial agents as the cause of diseases classified elsewhere: Secondary | ICD-10-CM | POA: Insufficient documentation

## 2019-10-07 DIAGNOSIS — O209 Hemorrhage in early pregnancy, unspecified: Secondary | ICD-10-CM | POA: Diagnosis not present

## 2019-10-07 DIAGNOSIS — O4690 Antepartum hemorrhage, unspecified, unspecified trimester: Secondary | ICD-10-CM

## 2019-10-07 DIAGNOSIS — Z833 Family history of diabetes mellitus: Secondary | ICD-10-CM | POA: Insufficient documentation

## 2019-10-07 DIAGNOSIS — O23591 Infection of other part of genital tract in pregnancy, first trimester: Secondary | ICD-10-CM | POA: Insufficient documentation

## 2019-10-07 DIAGNOSIS — Z792 Long term (current) use of antibiotics: Secondary | ICD-10-CM | POA: Insufficient documentation

## 2019-10-07 DIAGNOSIS — Z3490 Encounter for supervision of normal pregnancy, unspecified, unspecified trimester: Secondary | ICD-10-CM

## 2019-10-07 DIAGNOSIS — Z818 Family history of other mental and behavioral disorders: Secondary | ICD-10-CM | POA: Insufficient documentation

## 2019-10-07 DIAGNOSIS — F17210 Nicotine dependence, cigarettes, uncomplicated: Secondary | ICD-10-CM | POA: Insufficient documentation

## 2019-10-07 LAB — CBC WITH DIFFERENTIAL/PLATELET
Abs Immature Granulocytes: 0.01 10*3/uL (ref 0.00–0.07)
Basophils Absolute: 0.1 10*3/uL (ref 0.0–0.1)
Basophils Relative: 1 %
Eosinophils Absolute: 0.2 10*3/uL (ref 0.0–0.5)
Eosinophils Relative: 3 %
HCT: 36.3 % (ref 36.0–46.0)
Hemoglobin: 11.6 g/dL — ABNORMAL LOW (ref 12.0–15.0)
Immature Granulocytes: 0 %
Lymphocytes Relative: 30 %
Lymphs Abs: 2.2 10*3/uL (ref 0.7–4.0)
MCH: 24.5 pg — ABNORMAL LOW (ref 26.0–34.0)
MCHC: 32 g/dL (ref 30.0–36.0)
MCV: 76.7 fL — ABNORMAL LOW (ref 80.0–100.0)
Monocytes Absolute: 0.6 10*3/uL (ref 0.1–1.0)
Monocytes Relative: 8 %
Neutro Abs: 4.5 10*3/uL (ref 1.7–7.7)
Neutrophils Relative %: 58 %
Platelets: 276 10*3/uL (ref 150–400)
RBC: 4.73 MIL/uL (ref 3.87–5.11)
RDW: 21.9 % — ABNORMAL HIGH (ref 11.5–15.5)
WBC: 7.5 10*3/uL (ref 4.0–10.5)
nRBC: 0 % (ref 0.0–0.2)

## 2019-10-07 LAB — URINALYSIS, ROUTINE W REFLEX MICROSCOPIC
Bilirubin Urine: NEGATIVE
Glucose, UA: NEGATIVE mg/dL
Ketones, ur: NEGATIVE mg/dL
Nitrite: NEGATIVE
Protein, ur: 30 mg/dL — AB
RBC / HPF: >50 RBC/hpf — ABNORMAL HIGH (ref 0–5)
Specific Gravity, Urine: 1.018 (ref 1.005–1.030)
pH: 5 (ref 5.0–8.0)

## 2019-10-07 LAB — COMPREHENSIVE METABOLIC PANEL
ALT: 14 U/L (ref 0–44)
AST: 15 U/L (ref 15–41)
Albumin: 3.2 g/dL — ABNORMAL LOW (ref 3.5–5.0)
Alkaline Phosphatase: 43 U/L (ref 38–126)
Anion gap: 8 (ref 5–15)
BUN: 8 mg/dL (ref 6–20)
CO2: 21 mmol/L — ABNORMAL LOW (ref 22–32)
Calcium: 9.5 mg/dL (ref 8.9–10.3)
Chloride: 108 mmol/L (ref 98–111)
Creatinine, Ser: 0.6 mg/dL (ref 0.44–1.00)
GFR calc Af Amer: >60 mL/min (ref >60)
GFR calc non Af Amer: >60 mL/min (ref >60)
Glucose, Bld: 88 mg/dL (ref 70–99)
Potassium: 3.8 mmol/L (ref 3.5–5.1)
Sodium: 137 mmol/L (ref 135–145)
Total Bilirubin: 0.1 mg/dL — ABNORMAL LOW (ref 0.3–1.2)
Total Protein: 6.4 g/dL — ABNORMAL LOW (ref 6.5–8.1)

## 2019-10-07 LAB — WET PREP, GENITAL
Sperm: NONE SEEN
Trich, Wet Prep: NONE SEEN
Yeast Wet Prep HPF POC: NONE SEEN

## 2019-10-07 LAB — HIV ANTIBODY (ROUTINE TESTING W REFLEX): HIV Screen 4th Generation wRfx: NONREACTIVE

## 2019-10-07 LAB — HCG, QUANTITATIVE, PREGNANCY: hCG, Beta Chain, Quant, S: 3601 m[IU]/mL — ABNORMAL HIGH (ref <5)

## 2019-10-07 MED ORDER — METRONIDAZOLE 0.75 % VA GEL: 1.0000 | Freq: Every day | VAGINAL | 0 refills | Status: DC

## 2019-10-07 NOTE — Progress Notes (Signed)
Based on what you shared with me, I feel your condition warrants further evaluation and I recommend that you be seen for a face to face office visit as soon as possible. If you are pregnant and having vaginal pain with bleeding, you should be evaluated immediately.    NOTE: If you entered your credit card information for this eVisit, you will not be charged. You may see a "hold" on your card for the $35 but that hold will drop off and you will not have a charge processed.  If you are having a true medical emergency please call 911.     For an urgent face to face visit, [facility] has four urgent care centers for your convenience:   . [facility]    [phone]                  Get Driving Directions  [address], [place] [address] . 10 am to 8 pm Monday-Friday . 12 pm to 8 pm Saturday-Sunday   . [facility] [place]                  Get Driving Directions  [address], [place], [place] [address] . 8 am to 8 pm Monday-Friday . 9 am to 6 pm Saturday . 11 am to 6 pm Sunday   . [facility] at [place]                  Get Driving Directions   [address] [place], [place] [address] . 8 am to 8 pm Monday-Friday . 8 am to 4 pm Saturday-Sunday    . [facility] at [place]                    Get Driving Directions  [phone]  [address], [place], [place] [address]  . Monday-Friday, 12 PM to 6 PM    Your e-visit answers were reviewed by a board certified advanced clinical practitioner to complete your personal care plan.  Thank you for using e-Visits.

## 2019-10-07 NOTE — Discharge Instructions (Signed)
Vaginal Bleeding During Pregnancy, First Trimester ° °A small amount of bleeding from the vagina (spotting) is relatively common during early pregnancy. It usually stops on its own. Various things may cause bleeding or spotting during early pregnancy. Some bleeding may be related to the pregnancy, and some may not. In many cases, the bleeding is normal and is not a problem. However, bleeding can also be a sign of something serious. Be sure to tell your health care provider about any vaginal bleeding right away. °Some possible causes of vaginal bleeding during the first trimester include: °· Infection or inflammation of the cervix. °· Growths (polyps) on the cervix. °· Miscarriage or threatened miscarriage. °· Pregnancy tissue developing outside of the uterus (ectopic pregnancy). °· A mass of tissue developing in the uterus due to an egg being fertilized incorrectly (molar pregnancy). °Follow these instructions at home: °Activity °· Follow instructions from your health care provider about limiting your activity. Ask what activities are safe for you. °· If needed, make plans for someone to help with your regular activities. °· Do not have sex or orgasms until your health care provider says that this is safe. °General instructions °· Take over-the-counter and prescription medicines only as told by your health care provider. °· Pay attention to any changes in your symptoms. °· Do not use tampons or douche. °· Write down how many pads you use each day, how often you change pads, and how soaked (saturated) they are. °· If you pass any tissue from your vagina, save the tissue so you can show it to your health care provider. °· Keep all follow-up visits as told by your health care provider. This is important. °Contact a health care provider if: °· You have vaginal bleeding during any part of your pregnancy. °· You have cramps or labor pains. °· You have a fever. °Get help right away if: °· You have severe cramps in your  back or abdomen. °· You pass large clots or a large amount of tissue from your vagina. °· Your bleeding increases. °· You feel light-headed or weak, or you faint. °· You have chills. °· You are leaking fluid or have a gush of fluid from your vagina. °Summary °· A small amount of bleeding (spotting) from the vagina is relatively common during early pregnancy. °· Various things may cause bleeding or spotting in early pregnancy. °· Be sure to tell your health care provider about any vaginal bleeding right away. °This information is not intended to replace advice given to you by your health care provider. Make sure you discuss any questions you have with your health care provider. °Document Released: 09/10/2005 Document Revised: 03/22/2019 Document Reviewed: 03/05/2017 °Elsevier Patient Education © 2020 [employer] ° °
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
## 2019-10-07 NOTE — MAU Note (Signed)
Patient states she was having some spotting at the beginning of the week that has increased a little bit, having some small clots today.  Not heavy like a period and not soaking through any pads.  Having some mid abdominal pain on and off, but no pain now.

## 2019-10-08 ENCOUNTER — Encounter (HOSPITAL_COMMUNITY): Payer: Self-pay | Admitting: *Deleted

## 2019-10-08 ENCOUNTER — Inpatient Hospital Stay (HOSPITAL_COMMUNITY)
Admission: AD | Admit: 2019-10-08 | Discharge: 2019-10-08 | Disposition: A | Discharge status: Home / Self Care | Payer: Medicaid Other | Attending: Obstetrics & Gynecology | Admitting: Obstetrics & Gynecology

## 2019-10-08 DIAGNOSIS — F17210 Nicotine dependence, cigarettes, uncomplicated: Secondary | ICD-10-CM | POA: Diagnosis not present

## 2019-10-08 DIAGNOSIS — O99331 Smoking (tobacco) complicating pregnancy, first trimester: Secondary | ICD-10-CM | POA: Insufficient documentation

## 2019-10-08 DIAGNOSIS — Z3480 Encounter for supervision of other normal pregnancy, unspecified trimester: Secondary | ICD-10-CM

## 2019-10-08 DIAGNOSIS — Z3A10 10 weeks gestation of pregnancy: Secondary | ICD-10-CM | POA: Diagnosis not present

## 2019-10-08 DIAGNOSIS — O039 Complete or unspecified spontaneous abortion without complication: Secondary | ICD-10-CM | POA: Insufficient documentation

## 2019-10-08 LAB — CBC
HCT: 35.7 % — ABNORMAL LOW (ref 36.0–46.0)
Hemoglobin: 11.3 g/dL — ABNORMAL LOW (ref 12.0–15.0)
MCH: 24.3 pg — ABNORMAL LOW (ref 26.0–34.0)
MCHC: 31.7 g/dL (ref 30.0–36.0)
MCV: 76.8 fL — ABNORMAL LOW (ref 80.0–100.0)
Platelets: 274 10*3/uL (ref 150–400)
RBC: 4.65 MIL/uL (ref 3.87–5.11)
RDW: 22 % — ABNORMAL HIGH (ref 11.5–15.5)
WBC: 7.4 10*3/uL (ref 4.0–10.5)
nRBC: 0 % (ref 0.0–0.2)

## 2019-10-08 LAB — HCG, QUANTITATIVE, PREGNANCY: hCG, Beta Chain, Quant, S: 2694 m[IU]/mL — ABNORMAL HIGH (ref <5)

## 2019-10-08 MED ORDER — OXYCODONE-ACETAMINOPHEN 10-325 MG PO TABS: 1.0000 | ORAL_TABLET | Freq: Four times a day (QID) | ORAL | 0 refills | Status: DC | PRN

## 2019-10-08 MED ORDER — HYDROMORPHONE HCL 1 MG/ML IJ SOLN
2.0000 mg | Freq: Once | INTRAMUSCULAR | Status: AC
  Administered 2019-10-08: 2 mg via INTRAMUSCULAR
  Filled 2019-10-08: qty 2

## 2019-10-08 NOTE — MAU Note (Signed)
Christina Hancock is a 28 y.o. at [redacted]w[redacted]d here in MAU reporting: increased bleeding and new onset of pain. The bleeding got heavier this morning around 10, states she is having lots of blood clots. States she is bleeding a lot, put a pad on after her shower and came over here. Passing lots of clots.   Onset of complaint: ongoing but worse today  Pain score: 10/10  Vitals:   10/08/19 1432  BP: 129/69  Pulse: 88  Resp: 16  Temp: 98.2 F (36.8 C)  SpO2: 100%     Lab orders placed from triage: none
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
## 2019-10-11 ENCOUNTER — Encounter: Payer: Medicaid Other | Admitting: Advanced Practice Midwife

## 2019-10-11 LAB — GC/CHLAMYDIA PROBE AMP (~~LOC~~) NOT AT ARMC
Chlamydia: NEGATIVE
Comment: NEGATIVE
Comment: NORMAL
Neisseria Gonorrhea: NEGATIVE

## 2019-10-11 LAB — SURGICAL PATHOLOGY

## 2019-10-24 ENCOUNTER — Ambulatory Visit: Payer: Medicaid Other | Admitting: Family Medicine

## 2019-11-04 ENCOUNTER — Encounter (HOSPITAL_COMMUNITY): Payer: Self-pay | Admitting: Emergency Medicine

## 2019-11-04 ENCOUNTER — Other Ambulatory Visit: Payer: Self-pay

## 2019-11-04 ENCOUNTER — Emergency Department (HOSPITAL_COMMUNITY)
Admission: EM | Admit: 2019-11-04 | Discharge: 2019-11-04 | Disposition: A | Discharge status: Home / Self Care | Payer: Medicaid Other | Attending: Emergency Medicine | Admitting: Emergency Medicine

## 2019-11-04 DIAGNOSIS — Z79899 Other long term (current) drug therapy: Secondary | ICD-10-CM | POA: Diagnosis not present

## 2019-11-04 DIAGNOSIS — K029 Dental caries, unspecified: Secondary | ICD-10-CM | POA: Insufficient documentation

## 2019-11-04 DIAGNOSIS — F17210 Nicotine dependence, cigarettes, uncomplicated: Secondary | ICD-10-CM | POA: Diagnosis not present

## 2019-11-04 DIAGNOSIS — Z91040 Latex allergy status: Secondary | ICD-10-CM | POA: Diagnosis not present

## 2019-11-04 DIAGNOSIS — K0889 Other specified disorders of teeth and supporting structures: Secondary | ICD-10-CM | POA: Diagnosis present

## 2019-11-04 MED ORDER — PENICILLIN V POTASSIUM 500 MG PO TABS
500.0000 mg | ORAL_TABLET | Freq: Once | ORAL | Status: AC
Start: 2019-11-04 — End: 2019-11-04
  Administered 2019-11-04: 03:00:00 500 mg via ORAL
  Filled 2019-11-04: qty 1

## 2019-11-04 MED ORDER — NAPROXEN 375 MG PO TABS: 375.0000 mg | ORAL_TABLET | Freq: Two times a day (BID) | ORAL | 0 refills | Status: DC

## 2019-11-04 MED ORDER — IBUPROFEN 800 MG PO TABS
800.0000 mg | ORAL_TABLET | Freq: Once | ORAL | Status: AC
  Administered 2019-11-04: 03:00:00 800 mg via ORAL
  Filled 2019-11-04: qty 1

## 2019-11-04 MED ORDER — PENICILLIN V POTASSIUM 500 MG PO TABS: 500.0000 mg | ORAL_TABLET | Freq: Four times a day (QID) | ORAL | 0 refills | Status: AC

## 2019-11-04 NOTE — ED Triage Notes (Signed)
Patient complaining of dental pain.
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
## 2019-11-08 ENCOUNTER — Ambulatory Visit (HOSPITAL_COMMUNITY): Payer: Medicaid Other

## 2019-11-08 ENCOUNTER — Encounter: Payer: Medicaid Other | Admitting: Advanced Practice Midwife

## 2019-12-21 ENCOUNTER — Ambulatory Visit: Payer: Medicaid Other | Attending: Internal Medicine

## 2019-12-21 DIAGNOSIS — Z20822 Contact with and (suspected) exposure to covid-19: Secondary | ICD-10-CM

## 2019-12-22 LAB — NOVEL CORONAVIRUS, NAA: SARS-CoV-2, NAA: NOT DETECTED

## 2020-01-06 ENCOUNTER — Other Ambulatory Visit: Payer: Medicaid Other

## 2020-01-10 ENCOUNTER — Other Ambulatory Visit: Payer: Medicaid Other

## 2020-01-27 ENCOUNTER — Other Ambulatory Visit: Payer: Medicaid Other

## 2021-08-20 IMAGING — US US OB < 14 WEEKS - US OB TV
1 series · 15 of 28 positions shown · non-contrast
Comparison: None.

CLINICAL DATA: Vaginal bleeding

EXAM:
OBSTETRIC <14 WK US AND TRANSVAGINAL OB US
TECHNIQUE: Both transabdominal and transvaginal ultrasound examinations were
performed for complete evaluation of the gestation as well as the
maternal uterus, adnexal regions, and pelvic cul-de-sac.
Transvaginal technique was performed to assess early pregnancy.

[Series 1: us ob < 14 weeks - us ob tv · 15 of 60 slices shown]
[im 1/60]
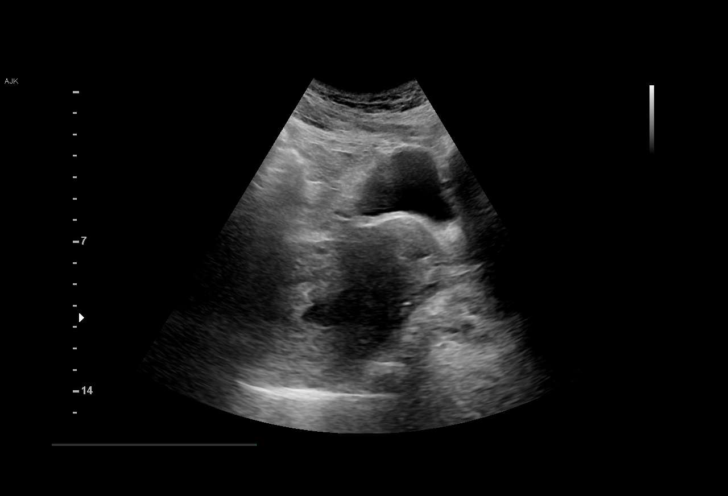
[im 5/60]
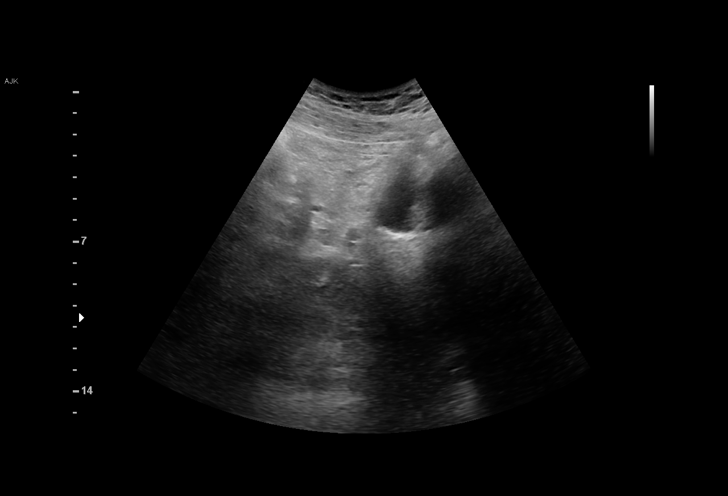
[im 9/60]
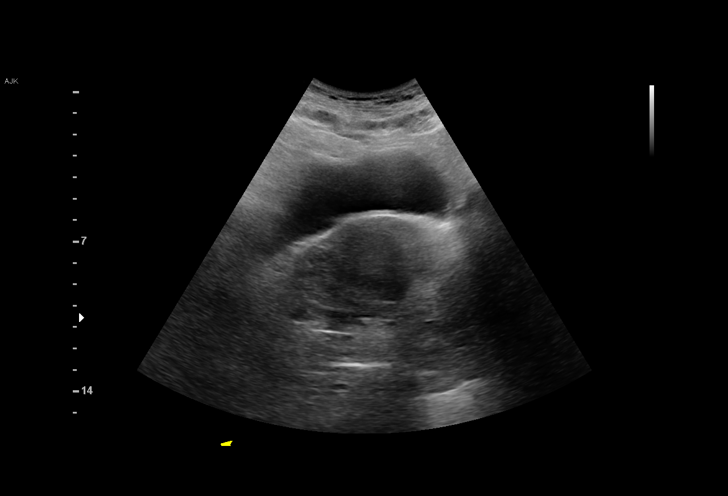
[im 14/60]
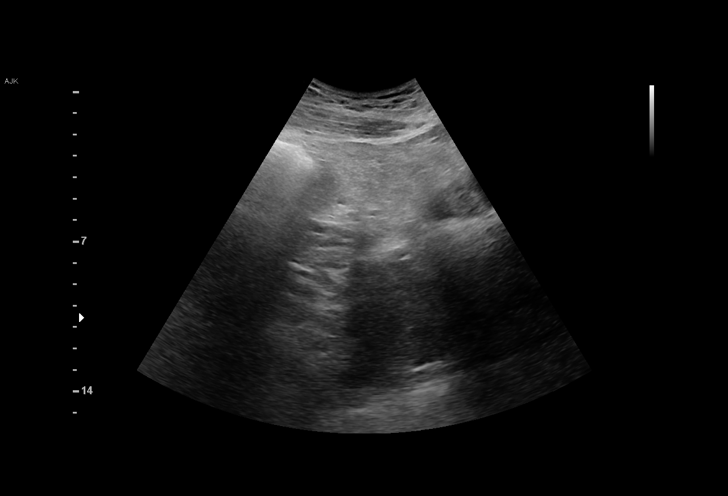
[im 18/60]
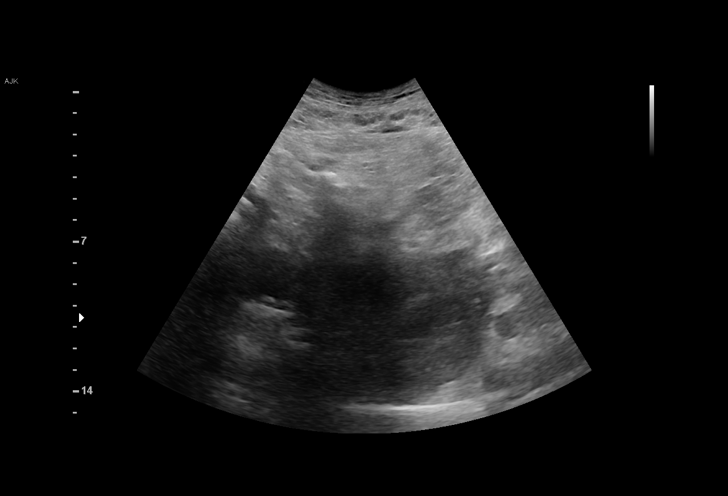
[im 22/60]
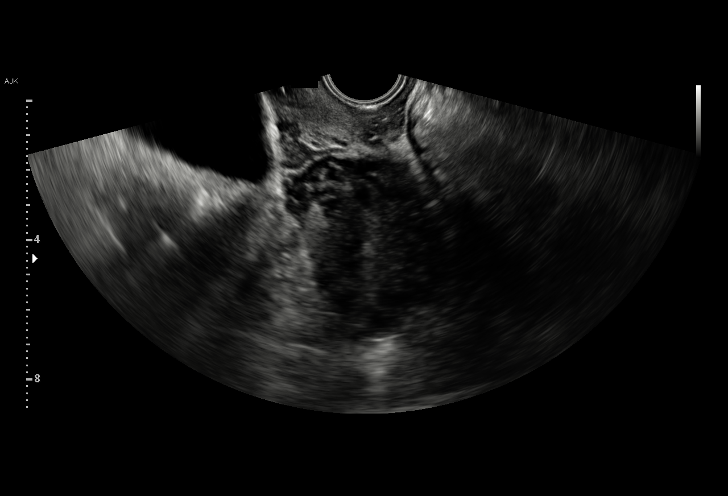
[im 27/60]
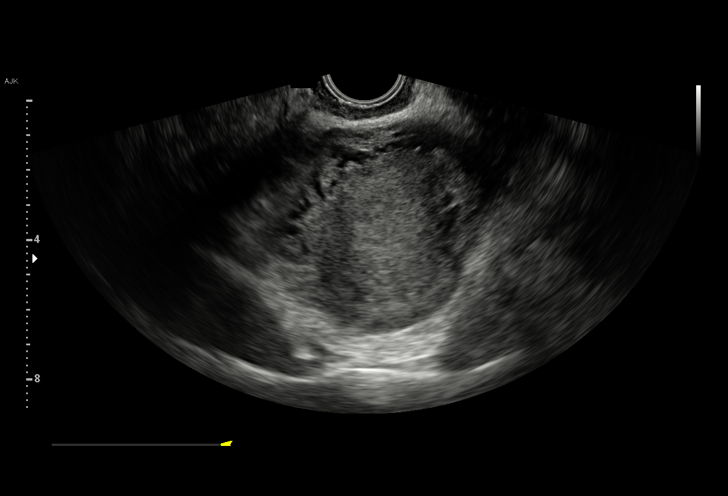
[im 31/60]
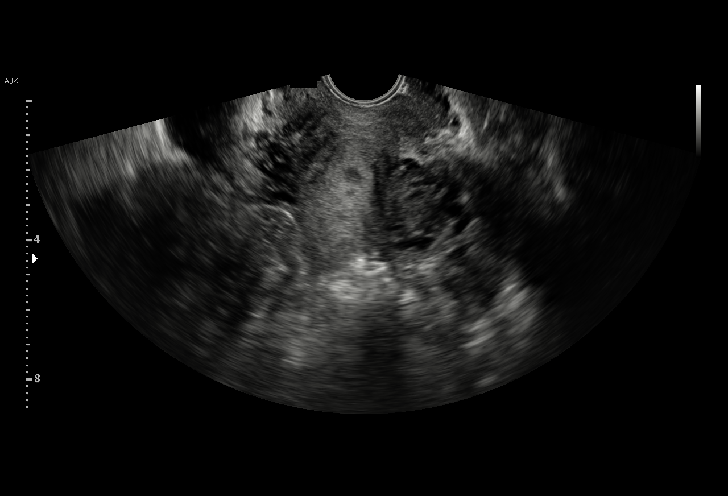
[im 33/60]
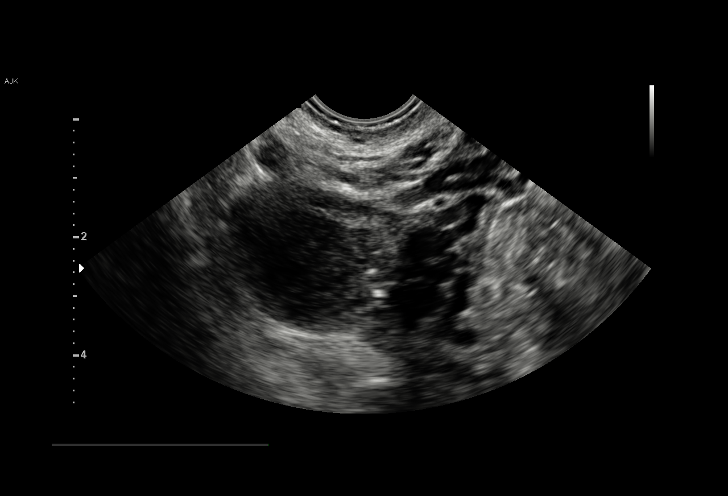
[im 38/60]
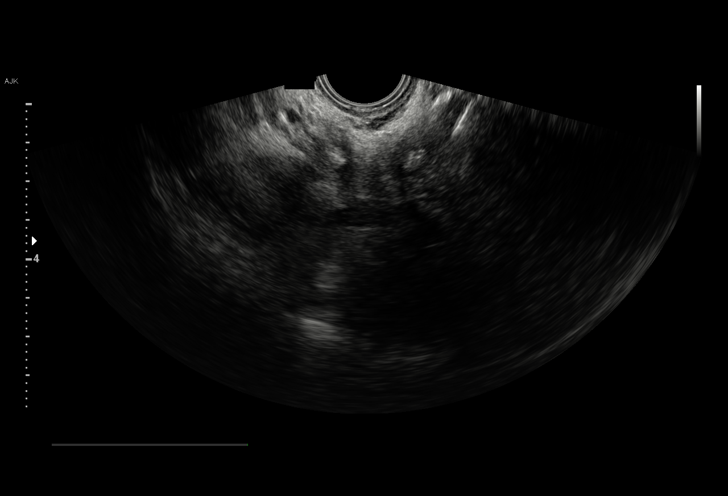
[im 42/60]
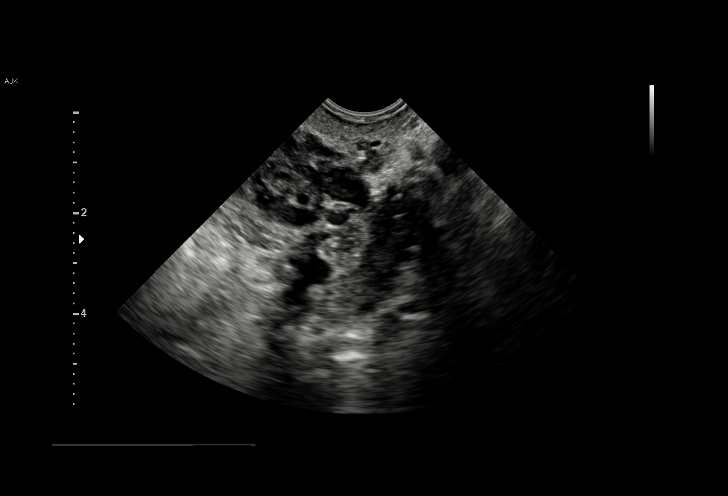
[im 46/60]
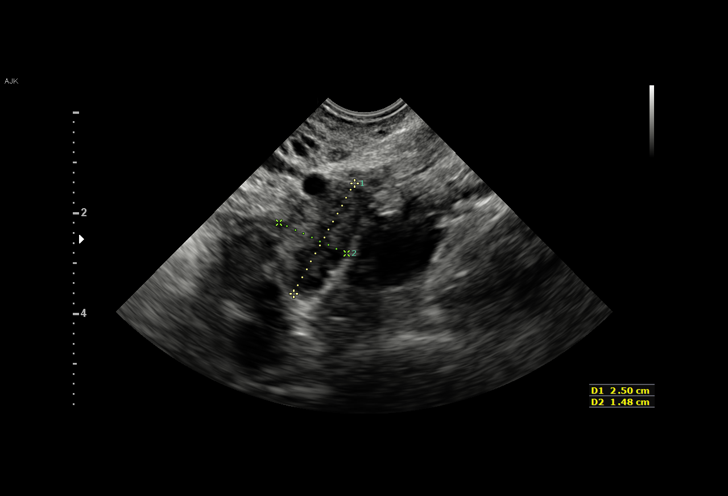
[im 51/60]
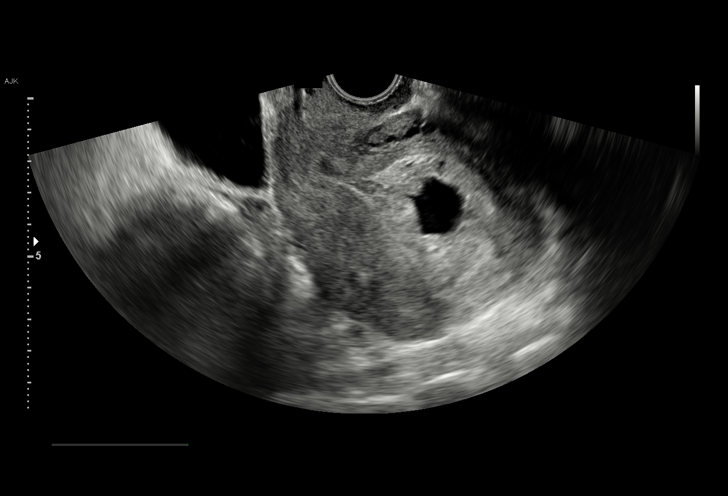
[im 55/60]
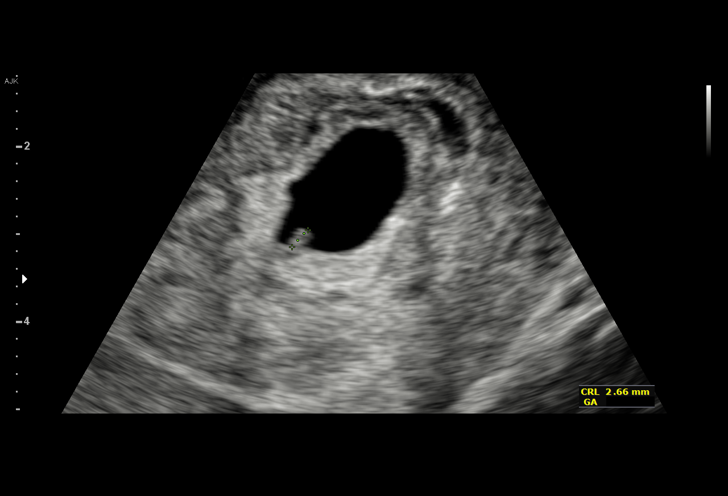
[im 60/60]
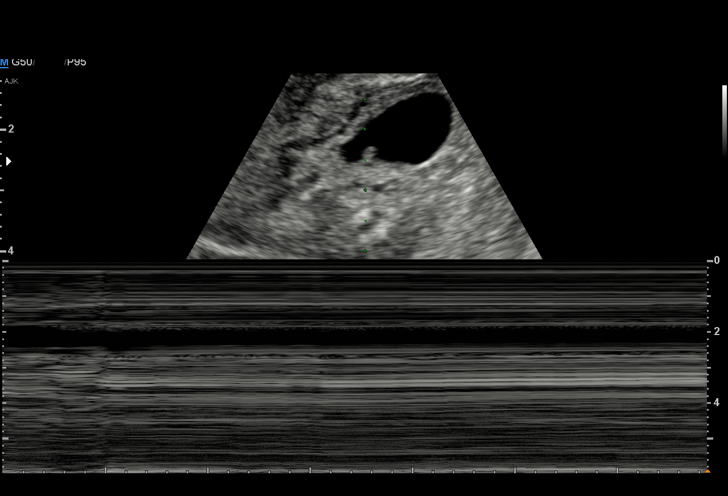

[15 of 28 positions shown; findings below may reference images not displayed]

FINDINGS: Intrauterine gestational sac: Single

Yolk sac:  Not Visualized.

Embryo: Ovoid tissue along the wall of the gestational sac
indeterminate for embryo. No fetal cardiac activity within the
structure.

MSD: 21.3 mm   7 w   0 d

Assuming ovoid tissue represents embryo, CRL: 2.8 mm 5 w 5 d US EDC:
05/30/2020

Subchorionic hemorrhage:  None visualized.

Maternal uterus/adnexae: Ovaries are within normal limits. Right
ovary measures 2.6 x 2.3 x 2.3 cm. Left ovary measures 2.5 x 1.5 x
1.8 cm.
IMPRESSION: 1. Intrauterine gestational sac but no definitive yolk sac. There is
ovoid tissue within the sac which is indeterminate for embryo. Mean
sac diameter of 21.3 mm. Findings are suspicious but not yet
definitive for failed pregnancy. Recommend follow-up US in 10-14
days for definitive diagnosis. This recommendation follows SRU
consensus guidelines: Diagnostic Criteria for Nonviable Pregnancy
Early in the First Trimester. N Engl J Med 5346; [DATE].

## 2023-01-13 ENCOUNTER — Ambulatory Visit: Payer: Self-pay

## 2023-10-26 ENCOUNTER — Other Ambulatory Visit (INDEPENDENT_AMBULATORY_CARE_PROVIDER_SITE_OTHER): Payer: MEDICAID

## 2023-10-26 ENCOUNTER — Ambulatory Visit: Payer: MEDICAID | Admitting: *Deleted

## 2023-10-26 ENCOUNTER — Other Ambulatory Visit (HOSPITAL_COMMUNITY)
Admission: RE | Admit: 2023-10-26 | Discharge: 2023-10-26 | Disposition: A | Discharge status: Home / Self Care | Payer: MEDICAID | Source: Ambulatory Visit | Attending: Obstetrics and Gynecology | Admitting: Obstetrics and Gynecology

## 2023-10-26 VITALS — BP 99/67 | HR 69 | Wt 291.9 lb

## 2023-10-26 DIAGNOSIS — O3680X0 Pregnancy with inconclusive fetal viability, not applicable or unspecified: Secondary | ICD-10-CM

## 2023-10-26 DIAGNOSIS — Z3A08 8 weeks gestation of pregnancy: Secondary | ICD-10-CM

## 2023-10-26 DIAGNOSIS — Z3480 Encounter for supervision of other normal pregnancy, unspecified trimester: Secondary | ICD-10-CM | POA: Insufficient documentation

## 2023-10-26 DIAGNOSIS — Z3481 Encounter for supervision of other normal pregnancy, first trimester: Secondary | ICD-10-CM | POA: Diagnosis not present

## 2023-10-26 DIAGNOSIS — Z1339 Encounter for screening examination for other mental health and behavioral disorders: Secondary | ICD-10-CM | POA: Diagnosis not present

## 2023-10-26 MED ORDER — BLOOD PRESSURE KIT DEVI: 1.0000 | 0 refills | Status: DC

## 2023-10-26 MED ORDER — PROMETHAZINE HCL 25 MG PO TABS: 25.0000 mg | ORAL_TABLET | Freq: Four times a day (QID) | ORAL | 1 refills | Status: DC | PRN

## 2023-10-26 MED ORDER — VITAFOL ULTRA 29-0.6-0.4-200 MG PO CAPS: 1.0000 | ORAL_CAPSULE | Freq: Every day | ORAL | 12 refills | Status: DC

## 2023-10-26 NOTE — Progress Notes (Addendum)
New OB Intake  I connected with Christina Hancock  on 10/26/23 at  2:10 PM EST by In Person visit and verified that I am speaking with the correct person using two identifiers. Nurse is located at [facility] and pt is located at [place].  I discussed the limitations, risks, security and privacy concerns of performing an evaluation and management service by telephone and the availability of in person appointments. I also discussed with the patient that there may be a patient responsible charge related to this service. The patient expressed understanding and agreed to proceed.  I explained I am completing New OB Intake today. We discussed EDD of 06/05/2024, by Last Menstrual Period. Pt is [id number]. I reviewed her allergies, medications and Medical/Surgical/OB history.    Patient Active Problem List   Diagnosis Date Noted   Supervision of other normal pregnancy, antepartum 09/12/2019   NSVD (normal spontaneous vaginal delivery) 12/17/2016   Labor and delivery, indication for care 12/16/2016    Concerns addressed today  Delivery Plans Plans to deliver at [facility]. Discussed the nature of our practice with multiple providers including residents and students. Due to the size of the practice, the delivering provider may not be the same as those providing prenatal care.   Patient is not interested in water birth. Offered upcoming OB visit with CNM to discuss further.  MyChart/Babyscripts MyChart access verified. I explained pt will have some visits in office and some virtually. Babyscripts instructions given and order placed. Patient verifies receipt of registration text/e-mail. Account successfully created and app downloaded.  Blood Pressure Cuff/Weight Scale Blood pressure cuff ordered for patient to pick-up from [employer]. Explained after first prenatal appt pt will check weekly and document in Babyscripts. Patient does not have weight scale; patient may purchase if they desire to track weight  weekly in Babyscripts.  Anatomy US Explained first scheduled [place] will be around 19 weeks. Anatomy US scheduled for TBD at TBD.  Interested in [place]? If yes, send referral and doula dot phrase.   Is patient a candidate for Babyscripts Optimization? Yes  First visit review I reviewed new OB appt with patient. Explained pt will be seen by Dr. Debroah Loop at first visit. Discussed Avelina Laine genetic screening with patient. Requests [facility] and [facility].. Routine prenatal labs  OB Urine and GC/CC only collected at today's visit.    Last Pap No results found for: "DIAGPAP"  Harrel Lemon, RN 10/26/2023  2:18 PM

## 2023-10-26 NOTE — Patient Instructions (Signed)

## 2023-10-27 ENCOUNTER — Telehealth: Payer: Self-pay | Admitting: Clinical

## 2023-10-27 LAB — CERVICOVAGINAL ANCILLARY ONLY
Chlamydia: NEGATIVE
Comment: NEGATIVE
Comment: NORMAL
Neisseria Gonorrhea: NEGATIVE

## 2023-10-27 NOTE — Telephone Encounter (Signed)
Attempt call regarding referral; Left HIPPA-compliant message to call back Mikle Sternberg from [facility] at  [phone] (Ashelyn Mccravy's office).    

## 2023-10-28 LAB — URINE CULTURE, OB REFLEX

## 2023-10-28 LAB — CULTURE, OB URINE

## 2023-11-16 ENCOUNTER — Ambulatory Visit (INDEPENDENT_AMBULATORY_CARE_PROVIDER_SITE_OTHER): Payer: MEDICAID | Admitting: Licensed Clinical Social Worker

## 2023-11-16 ENCOUNTER — Inpatient Hospital Stay (HOSPITAL_COMMUNITY)
Admission: AD | Admit: 2023-11-16 | Discharge: 2023-11-16 | Disposition: A | Discharge status: Home / Self Care | Payer: MEDICAID | Attending: Obstetrics and Gynecology | Admitting: Obstetrics and Gynecology

## 2023-11-16 ENCOUNTER — Ambulatory Visit (INDEPENDENT_AMBULATORY_CARE_PROVIDER_SITE_OTHER): Payer: MEDICAID | Admitting: Obstetrics & Gynecology

## 2023-11-16 VITALS — BP 115/75 | HR 91 | Wt 294.0 lb

## 2023-11-16 DIAGNOSIS — O209 Hemorrhage in early pregnancy, unspecified: Secondary | ICD-10-CM | POA: Diagnosis present

## 2023-11-16 DIAGNOSIS — O09291 Supervision of pregnancy with other poor reproductive or obstetric history, first trimester: Secondary | ICD-10-CM | POA: Diagnosis not present

## 2023-11-16 DIAGNOSIS — O99211 Obesity complicating pregnancy, first trimester: Secondary | ICD-10-CM | POA: Diagnosis not present

## 2023-11-16 DIAGNOSIS — Z3A11 11 weeks gestation of pregnancy: Secondary | ICD-10-CM | POA: Diagnosis not present

## 2023-11-16 DIAGNOSIS — F4323 Adjustment disorder with mixed anxiety and depressed mood: Secondary | ICD-10-CM | POA: Diagnosis not present

## 2023-11-16 DIAGNOSIS — O99341 Other mental disorders complicating pregnancy, first trimester: Secondary | ICD-10-CM | POA: Diagnosis not present

## 2023-11-16 DIAGNOSIS — Z3480 Encounter for supervision of other normal pregnancy, unspecified trimester: Secondary | ICD-10-CM

## 2023-11-16 DIAGNOSIS — O99210 Obesity complicating pregnancy, unspecified trimester: Secondary | ICD-10-CM

## 2023-11-16 DIAGNOSIS — F32A Depression, unspecified: Secondary | ICD-10-CM

## 2023-11-16 LAB — URINALYSIS, ROUTINE W REFLEX MICROSCOPIC
Bacteria, UA: NONE SEEN
Bilirubin Urine: NEGATIVE
Glucose, UA: NEGATIVE mg/dL
Ketones, ur: NEGATIVE mg/dL
Leukocytes,Ua: NEGATIVE
Nitrite: NEGATIVE
Protein, ur: NEGATIVE mg/dL
Specific Gravity, Urine: 1.021 (ref 1.005–1.030)
pH: 6 (ref 5.0–8.0)

## 2023-11-16 MED ORDER — SERTRALINE HCL 25 MG PO TABS: 25.0000 mg | ORAL_TABLET | Freq: Every day | ORAL | 2 refills | Status: AC
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
## 2023-11-16 NOTE — MAU Note (Signed)
.  Christina Hancock is a 32 y.o. at [redacted]w[redacted]d here in MAU reporting: Vaginal bleeding that began around 1530 after getting home from her OB appointment. She reports she used the restroom and noted the blood in the toilet and on her tissue. Denies blood clots. Denies vaginal itching and vaginal odors. Denies pain. Denies recent IC.  Onset of complaint: 1530 today Pain score: Denies pain.  Vitals:   11/16/23 1636  BP: 137/65  Pulse: 85  Resp: 16  Temp: 98.2 F (36.8 C)  SpO2: 100%      FHT: 174 doppler Lab orders placed from triage: UA

## 2023-11-16 NOTE — Progress Notes (Signed)
[place] scheduled 01/26/24
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
## 2023-11-16 NOTE — BH Specialist Note (Unsigned)
Integrated Behavioral Health Initial In-Person Visit  MRN: [id number] Name: Christina Hancock  Number of Integrated Behavioral Health Clinician visits: 1- Initial Visit  Session Start time: 1347  Session End time: 1418  Total time in minutes: 31   Types of Service: Individual psychotherapy  Interpretor:No. Interpretor Name and Language: None    Warm Hand Off Completed.        Subjective: Christina Hancock is a 32 y.o. female accompanied by  self Patient was referred by Montez Morita for History of Anxiety and Depression. Patient reports the following symptoms/concerns: increased depressive and anxiety symptoms.  Duration of problem: Years; Severity of problem: moderate  Objective: Mood: Euthymic and Affect: Appropriate Risk of harm to self or others: No plan to harm self or others  Life Context: Family and Social: Patient lives alone with children.  School/Work: Patient is currently unemployment  Self-Care: Patient reports she likes to sleep.  Life Changes: Patient is currently pregnant.   Patient and/or Family's Strengths/Protective Factors: Social and Emotional competence, Concrete supports in place (healthy food, safe environments, etc.), Physical Health (exercise, healthy diet, medication compliance, etc.), and Parental Resilience  Goals Addressed: Patient will: Reduce symptoms of: anxiety and depression Increase knowledge and/or ability of: coping skills and healthy habits  Demonstrate ability to: Increase healthy adjustment to current life circumstances  Progress towards Goals: Ongoing  Interventions: Interventions utilized: Mindfulness or Management consultant, Supportive Counseling, Psychoeducation and/or Health Education, and Supportive Reflection  Standardized Assessments completed: Not Needed  Patient and/or Family Response: Patient was present for today's session and discussed her history of medication management for depression and anxiety through [facility].  She reported discontinuing Zoloft due to her current pregnancy.  Patient expressed significant stress and anger related to her relationships with father of baby, as well as difficulties in maintaining interpersonal relationships. Patient also identified current stressor, including being unemployed,  which contribute to feelings of low energy and lack of motivation.  She reported experiencing racing thoughts at night and difficulty sleeping due to vivid dreams. Patient shared that her current coping strategies include reading, cleaning, journaling and she is willing to utilize these strategies to reduce symptoms.   Patient Centered Plan: Patient is on the following Treatment Plan(s):  Anxiety and depression  Assessment: Patient currently experiencing heightened stress and emotional distress related to her pregnancy, current relationship difficulties ,and unemployment. Patient reports symptoms of depression and anxiety, including low energy, lack of motivation, racing thoughts and sleep disturbances which are impacting her emotional well-being.   Patient may benefit from continued support of this clinic to gain knowledge and implement positive coping mechanisms.  Plan: Follow up with behavioral health clinician on : 12/01/2023 Behavioral recommendations: Continue taking prenatal vitamins, download the meditation app and start journaling before bed. You can also practice deep breathing, guided meditation to reduce racing thoughts and improve sleep quality. Remember to reach out to family supports when needed.  Referral(s): Integrated [employer] (In Clinic) "From scale of 1-10, how likely are you to follow plan?": Patient agreeable to above plan.   Aarik Blank Cruzita Lederer, LCSWA

## 2023-11-16 NOTE — Discharge Instructions (Signed)
Your ultrasound shows a healthy pregnancy. We discussed that vaginal bleeding in the first trimester is common, and that 80-90% of patients will go on to have a normal pregnancy with a live delivery. The remainder are at increased risk for miscarriage, unfortunately there are no known interventions to mitigate this risk. We discussed return precautions including crescendo abdominal pain, heavy vaginal bleeding soaking >1 pad/hour, and fever.

## 2023-11-18 LAB — CBC/D/PLT+RPR+RH+ABO+RUBIGG...
Antibody Screen: NEGATIVE
Basophils Absolute: 0 10*3/uL (ref 0.0–0.2)
Basos: 0 %
EOS (ABSOLUTE): 0.1 10*3/uL (ref 0.0–0.4)
Eos: 1 %
HCV Ab: NONREACTIVE
HIV Screen 4th Generation wRfx: NONREACTIVE
Hematocrit: 34.5 % (ref 34.0–46.6)
Hemoglobin: 10 g/dL — ABNORMAL LOW (ref 11.1–15.9)
Hepatitis B Surface Ag: NEGATIVE
Immature Grans (Abs): 0 10*3/uL (ref 0.0–0.1)
Immature Granulocytes: 0 %
Lymphocytes Absolute: 1.7 10*3/uL (ref 0.7–3.1)
Lymphs: 24 %
MCH: 20.8 pg — ABNORMAL LOW (ref 26.6–33.0)
MCHC: 29 g/dL — ABNORMAL LOW (ref 31.5–35.7)
MCV: 72 fL — ABNORMAL LOW (ref 79–97)
Monocytes Absolute: 0.5 10*3/uL (ref 0.1–0.9)
Monocytes: 8 %
Neutrophils Absolute: 4.8 10*3/uL (ref 1.4–7.0)
Neutrophils: 67 %
Platelets: 452 10*3/uL — ABNORMAL HIGH (ref 150–450)
RBC: 4.8 x10E6/uL (ref 3.77–5.28)
RDW: 19.4 % — ABNORMAL HIGH (ref 11.7–15.4)
RPR Ser Ql: REACTIVE — AB
Rh Factor: POSITIVE
Rubella Antibodies, IGG: 1.46 {index} (ref >0.99)
WBC: 7.2 10*3/uL (ref 3.4–10.8)

## 2023-11-18 LAB — RPR, QUANT+TP ABS (REFLEX)
Rapid Plasma Reagin, Quant: 1:2 {titer} — ABNORMAL HIGH
T Pallidum Abs: REACTIVE — AB

## 2023-11-18 LAB — HEMOGLOBIN A1C
Est. average glucose Bld gHb Est-mCnc: 120 mg/dL
Hgb A1c MFr Bld: 5.8 % — ABNORMAL HIGH (ref 4.8–5.6)

## 2023-11-18 LAB — HCV INTERPRETATION

## 2023-11-21 LAB — PANORAMA PRENATAL TEST FULL PANEL:PANORAMA TEST PLUS 5 ADDITIONAL MICRODELETIONS: FETAL FRACTION: 1.8

## 2023-11-23 LAB — HORIZON CUSTOM: REPORT SUMMARY: NEGATIVE

## 2023-11-24 ENCOUNTER — Telehealth: Payer: Self-pay | Admitting: *Deleted

## 2023-11-24 ENCOUNTER — Encounter: Payer: Self-pay | Admitting: Obstetrics and Gynecology

## 2023-11-24 NOTE — Telephone Encounter (Signed)
RTC. Pt concerned for Panorama results "insufficient fetal cells." Advised pt that this results just means the sample needs to be recollected. Reassured pt that this happens sometimes with gestation is early. Offered lab only redraw appt 2 weeks from original draw date. Pt accepted. Call transferred to schedulers.

## 2023-12-01 ENCOUNTER — Other Ambulatory Visit: Payer: MEDICAID

## 2023-12-01 ENCOUNTER — Ambulatory Visit: Payer: Self-pay | Admitting: Licensed Clinical Social Worker

## 2023-12-01 ENCOUNTER — Encounter: Payer: Self-pay | Admitting: Licensed Clinical Social Worker

## 2023-12-01 DIAGNOSIS — Z3402 Encounter for supervision of normal first pregnancy, second trimester: Secondary | ICD-10-CM

## 2023-12-01 DIAGNOSIS — Z91199 Patient's noncompliance with other medical treatment and regimen due to unspecified reason: Secondary | ICD-10-CM

## 2023-12-01 NOTE — BH Specialist Note (Addendum)
Patient no showed to this appointment. Virtual link sent to patient and phone call was also provided as a reminder. A VM was left.

## 2023-12-03 ENCOUNTER — Inpatient Hospital Stay (HOSPITAL_COMMUNITY)
Admission: AD | Admit: 2023-12-03 | Discharge: 2023-12-03 | Disposition: A | Discharge status: Home / Self Care | Payer: MEDICAID | Attending: Obstetrics & Gynecology | Admitting: Obstetrics & Gynecology

## 2023-12-03 ENCOUNTER — Inpatient Hospital Stay (HOSPITAL_COMMUNITY): Payer: MEDICAID

## 2023-12-03 ENCOUNTER — Encounter (HOSPITAL_COMMUNITY): Payer: Self-pay | Admitting: Obstetrics & Gynecology

## 2023-12-03 DIAGNOSIS — O3680X0 Pregnancy with inconclusive fetal viability, not applicable or unspecified: Secondary | ICD-10-CM | POA: Diagnosis not present

## 2023-12-03 DIAGNOSIS — Z87891 Personal history of nicotine dependence: Secondary | ICD-10-CM | POA: Diagnosis not present

## 2023-12-03 DIAGNOSIS — O021 Missed abortion: Secondary | ICD-10-CM

## 2023-12-03 DIAGNOSIS — O209 Hemorrhage in early pregnancy, unspecified: Secondary | ICD-10-CM | POA: Diagnosis present

## 2023-12-03 DIAGNOSIS — R109 Unspecified abdominal pain: Secondary | ICD-10-CM | POA: Diagnosis present

## 2023-12-03 LAB — CBC
HCT: 32.8 % — ABNORMAL LOW (ref 36.0–46.0)
HCT: 32.1 % — ABNORMAL LOW (ref 36.0–46.0)
Hemoglobin: 10.1 g/dL — ABNORMAL LOW (ref 12.0–15.0)
Hemoglobin: 9.6 g/dL — ABNORMAL LOW (ref 12.0–15.0)
MCH: 21.2 pg — ABNORMAL LOW (ref 26.0–34.0)
MCH: 20.7 pg — ABNORMAL LOW (ref 26.0–34.0)
MCHC: 30.8 g/dL (ref 30.0–36.0)
MCHC: 29.9 g/dL — ABNORMAL LOW (ref 30.0–36.0)
MCV: 68.8 fL — ABNORMAL LOW (ref 80.0–100.0)
MCV: 69.3 fL — ABNORMAL LOW (ref 80.0–100.0)
Platelets: 378 10*3/uL (ref 150–400)
Platelets: 375 10*3/uL (ref 150–400)
RBC: 4.77 MIL/uL (ref 3.87–5.11)
RBC: 4.63 MIL/uL (ref 3.87–5.11)
RDW: 19.9 % — ABNORMAL HIGH (ref 11.5–15.5)
RDW: 19.9 % — ABNORMAL HIGH (ref 11.5–15.5)
WBC: 12.9 10*3/uL — ABNORMAL HIGH (ref 4.0–10.5)
WBC: 11.3 10*3/uL — ABNORMAL HIGH (ref 4.0–10.5)
nRBC: 0 % (ref 0.0–0.2)
nRBC: 0 % (ref 0.0–0.2)

## 2023-12-03 LAB — URINALYSIS, ROUTINE W REFLEX MICROSCOPIC
Bilirubin Urine: NEGATIVE
Glucose, UA: NEGATIVE mg/dL
Ketones, ur: NEGATIVE mg/dL
Nitrite: NEGATIVE
Protein, ur: 30 mg/dL — AB
RBC / HPF: >50 RBC/hpf (ref 0–5)
Specific Gravity, Urine: 1.024 (ref 1.005–1.030)
pH: 6 (ref 5.0–8.0)

## 2023-12-03 LAB — TYPE AND SCREEN
ABO/RH(D): A POS
Antibody Screen: NEGATIVE

## 2023-12-03 MED ORDER — OXYCODONE HCL 5 MG PO TABS
5.0000 mg | ORAL_TABLET | Freq: Once | ORAL | Status: AC
  Administered 2023-12-03: 5 mg via ORAL
  Filled 2023-12-03: qty 1

## 2023-12-03 MED ORDER — ONDANSETRON HCL 4 MG/2ML IJ SOLN
4.0000 mg | Freq: Once | INTRAMUSCULAR | Status: AC
  Administered 2023-12-03: 4 mg via INTRAVENOUS

## 2023-12-03 MED ORDER — ONDANSETRON HCL 4 MG/2ML IJ SOLN
INTRAMUSCULAR | Status: AC
  Administered 2023-12-03: 4 mg via INTRAVENOUS
  Filled 2023-12-03: qty 2

## 2023-12-03 MED ORDER — METHYLERGONOVINE MALEATE 0.2 MG PO TABS
0.2000 mg | ORAL_TABLET | Freq: Once | ORAL | Status: AC
  Administered 2023-12-03: 0.2 mg via ORAL
  Filled 2023-12-03: qty 1

## 2023-12-03 MED ORDER — MISOPROSTOL 200 MCG PO TABS
400.0000 ug | ORAL_TABLET | Freq: Once | ORAL | Status: AC
  Administered 2023-12-03: 400 ug via BUCCAL
  Filled 2023-12-03: qty 2

## 2023-12-03 MED ORDER — OXYTOCIN-SODIUM CHLORIDE 30-0.9 UT/500ML-% IV SOLN
2.5000 [IU]/h | INTRAVENOUS | Status: DC
  Filled 2023-12-03: qty 500

## 2023-12-03 MED ORDER — OXYCODONE HCL 5 MG PO TABS: 5.0000 mg | ORAL_TABLET | ORAL | 0 refills | Status: DC | PRN

## 2023-12-03 MED ORDER — KETOROLAC TROMETHAMINE 30 MG/ML IJ SOLN
30.0000 mg | Freq: Once | INTRAMUSCULAR | Status: AC
  Administered 2023-12-03: 30 mg via INTRAVENOUS
  Filled 2023-12-03: qty 1

## 2023-12-03 MED ORDER — FENTANYL CITRATE (PF) 100 MCG/2ML IJ SOLN
100.0000 ug | Freq: Once | INTRAMUSCULAR | Status: AC
  Administered 2023-12-03: 100 ug via INTRAMUSCULAR
  Filled 2023-12-03: qty 2

## 2023-12-03 MED ORDER — OXYTOCIN BOLUS FROM INFUSION
333.0000 mL | Freq: Once | INTRAVENOUS | Status: AC
  Administered 2023-12-03: 333 mL via INTRAVENOUS

## 2023-12-03 NOTE — MAU Note (Signed)
.  Christina Hancock is a 32 y.o. at [redacted]w[redacted]d here in MAU reporting: she was seen in MAU on 12/2 for spotting and has continued to spot since then. However, she began having lower abdominal cramping this morning around 0600 (7/10) and went to the bathroom around 0800 and had a gush a blood. Denies recent IC. She put on a pad and came to the hospital. Denies abnormal discharge.  LMP: N/A Onset of complaint: Today Pain score: 7/10 Vitals:   12/03/23 0916  BP: (!) 141/74  Pulse: 91  Resp: 18  Temp: 98 F (36.7 C)     FHT: RN attempted and unable to doppler FHT, Dorathy Kinsman, CNM notified and instructed RN that they would put in additional orders for lab work and come to evaluate the patient  Lab orders placed from triage:  UA
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
## 2023-12-03 NOTE — Discharge Instructions (Addendum)
We are very sorry for your loss.  You came in for increased vaginal bleeding and abdominal pain. We looked for fetal heart tones and were unable to find any with doppler or ultrasound. A formal ultrasound showed no heartbeat in the fetus. When we examined your cervix, the fetus delivered, but the placenta took some time to deliver. We discussed the risks and benefits of trying medicine to remove the placenta or going to the operating room to remove it. We agreed on medicines (Cytotec and Pitocin) to help push this out.  We suggest seeing your OBGYN or Family doctor to discuss birth control if you do not desire to be pregnant.   If you experience fever, worse abdominal pain, increased bleeding, or foul-smelling vaginal discharge, please return to the maternity assessment unit, as this could be signs of infection or retained products in the uterus.

## 2023-12-07 LAB — PANORAMA PRENATAL TEST FULL PANEL:PANORAMA TEST PLUS 5 ADDITIONAL MICRODELETIONS: FETAL FRACTION: 2.2

## 2023-12-08 LAB — SURGICAL PATHOLOGY

## 2023-12-14 ENCOUNTER — Encounter: Payer: Self-pay | Admitting: Obstetrics and Gynecology

## 2023-12-14 ENCOUNTER — Ambulatory Visit (INDEPENDENT_AMBULATORY_CARE_PROVIDER_SITE_OTHER): Payer: MEDICAID | Admitting: Obstetrics and Gynecology

## 2023-12-14 VITALS — BP 138/83 | HR 90 | Wt 297.1 lb

## 2023-12-14 DIAGNOSIS — Z30430 Encounter for insertion of intrauterine contraceptive device: Secondary | ICD-10-CM | POA: Diagnosis not present

## 2023-12-14 DIAGNOSIS — Z3A13 13 weeks gestation of pregnancy: Secondary | ICD-10-CM

## 2023-12-14 DIAGNOSIS — Z3202 Encounter for pregnancy test, result negative: Secondary | ICD-10-CM | POA: Diagnosis not present

## 2023-12-14 DIAGNOSIS — O039 Complete or unspecified spontaneous abortion without complication: Secondary | ICD-10-CM

## 2023-12-14 LAB — POCT URINE PREGNANCY: Preg Test, Ur: NEGATIVE

## 2023-12-14 MED ORDER — LEVONORGESTREL 20 MCG/DAY IU IUD
1.0000 | INTRAUTERINE_SYSTEM | Freq: Once | INTRAUTERINE | Status: AC
  Administered 2023-12-14: 1 via INTRAUTERINE
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
## 2023-12-14 NOTE — Progress Notes (Signed)
Pt presents for f/u for SAB. Pt reports still cramping that comes and goes, still having spotting, and some abdominal pain. Pt would like to discuss birth control options.

## 2024-01-01 ENCOUNTER — Other Ambulatory Visit: Payer: Self-pay

## 2024-01-01 ENCOUNTER — Encounter (HOSPITAL_BASED_OUTPATIENT_CLINIC_OR_DEPARTMENT_OTHER): Payer: Self-pay

## 2024-01-01 ENCOUNTER — Emergency Department (HOSPITAL_BASED_OUTPATIENT_CLINIC_OR_DEPARTMENT_OTHER)
Admission: EM | Admit: 2024-01-01 | Discharge: 2024-01-01 | Discharge status: Against Medical Advice | Payer: MEDICAID | Attending: Emergency Medicine | Admitting: Emergency Medicine

## 2024-01-01 DIAGNOSIS — H5789 Other specified disorders of eye and adnexa: Secondary | ICD-10-CM | POA: Diagnosis present

## 2024-01-01 DIAGNOSIS — Z5321 Procedure and treatment not carried out due to patient leaving prior to being seen by health care provider: Secondary | ICD-10-CM | POA: Insufficient documentation

## 2024-01-01 NOTE — ED Triage Notes (Signed)
Pt states "I think I have pink eye." States she has hx "blisters on the eye that's gotten me hospitalized." +Photosensitivity , itchiness, discharge  Redness to L eye

## 2024-01-05 ENCOUNTER — Ambulatory Visit (HOSPITAL_COMMUNITY)
Admission: EM | Admit: 2024-01-05 | Discharge: 2024-01-05 | Disposition: A | Discharge status: Home / Self Care | Payer: MEDICAID

## 2024-01-05 ENCOUNTER — Encounter (HOSPITAL_COMMUNITY): Payer: Self-pay

## 2024-01-05 DIAGNOSIS — B0050 Herpesviral ocular disease, unspecified: Secondary | ICD-10-CM

## 2024-01-05 MED ORDER — FLUORESCEIN SODIUM 1 MG OP STRP
ORAL_STRIP | OPHTHALMIC | Status: AC
  Filled 2024-01-05: qty 1

## 2024-01-05 MED ORDER — TETRACAINE HCL 0.5 % OP SOLN
OPHTHALMIC | Status: AC
  Filled 2024-01-05: qty 4

## 2024-01-05 MED ORDER — GANCICLOVIR 0.15 % OP GEL: 1.0000 [drp] | Freq: Every day | OPHTHALMIC | 0 refills | Status: AC
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
## 2024-01-05 NOTE — ED Triage Notes (Signed)
Patient here today with c/o left eye irritation since last Sunday. Patient went to [facility] and was given Cipro eye drops and patient feels that it has been worsening. She has been using the drops QID.

## 2024-01-05 NOTE — Discharge Instructions (Addendum)
Tell them you have corneal herpes with possible iritis

## 2024-01-11 ENCOUNTER — Encounter: Payer: MEDICAID | Admitting: Advanced Practice Midwife

## 2024-01-26 ENCOUNTER — Ambulatory Visit: Payer: MEDICAID

## 2024-01-26 ENCOUNTER — Other Ambulatory Visit: Payer: MEDICAID

## 2024-02-27 ENCOUNTER — Other Ambulatory Visit: Payer: Self-pay | Admitting: Obstetrics & Gynecology

## 2024-02-27 DIAGNOSIS — Z3480 Encounter for supervision of other normal pregnancy, unspecified trimester: Secondary | ICD-10-CM
